# Patient Record
Sex: Female | Born: 1950 | Race: White | Hispanic: No | Marital: Married | Smoking: Never smoker
Health system: Southern US, Community
[De-identification: ages and names within clinical notes are randomized; demographics above are authoritative.]

## PROBLEM LIST (undated history)

## (undated) DIAGNOSIS — C4490 Unspecified malignant neoplasm of skin, unspecified: Secondary | ICD-10-CM

## (undated) DIAGNOSIS — E039 Hypothyroidism, unspecified: Secondary | ICD-10-CM

## (undated) HISTORY — DX: Hypothyroidism, unspecified: E03.9

## (undated) HISTORY — DX: Unspecified malignant neoplasm of skin, unspecified: C44.90

---

## 1999-06-03 ENCOUNTER — Encounter: Payer: Self-pay | Admitting: Family Medicine

## 1999-06-03 ENCOUNTER — Encounter: Admission: RE | Admit: 1999-06-03 | Discharge: 1999-06-03 | Payer: Self-pay | Admitting: Family Medicine

## 2000-06-03 ENCOUNTER — Encounter: Payer: Self-pay | Admitting: Family Medicine

## 2000-06-03 ENCOUNTER — Encounter: Admission: RE | Admit: 2000-06-03 | Discharge: 2000-06-03 | Payer: Self-pay | Admitting: Family Medicine

## 2001-05-25 ENCOUNTER — Other Ambulatory Visit: Admission: RE | Admit: 2001-05-25 | Discharge: 2001-05-25 | Payer: Self-pay | Admitting: Family Medicine

## 2001-06-15 ENCOUNTER — Encounter: Admission: RE | Admit: 2001-06-15 | Discharge: 2001-06-15 | Payer: Self-pay | Admitting: Family Medicine

## 2001-06-15 ENCOUNTER — Encounter: Payer: Self-pay | Admitting: Family Medicine

## 2002-05-26 ENCOUNTER — Other Ambulatory Visit: Admission: RE | Admit: 2002-05-26 | Discharge: 2002-05-26 | Payer: Self-pay | Admitting: Family Medicine

## 2002-06-17 ENCOUNTER — Encounter: Admission: RE | Admit: 2002-06-17 | Discharge: 2002-06-17 | Payer: Self-pay | Admitting: Family Medicine

## 2002-06-17 ENCOUNTER — Encounter: Payer: Self-pay | Admitting: Family Medicine

## 2003-06-01 ENCOUNTER — Other Ambulatory Visit: Admission: RE | Admit: 2003-06-01 | Discharge: 2003-06-01 | Payer: Self-pay | Admitting: Family Medicine

## 2003-06-20 ENCOUNTER — Encounter: Admission: RE | Admit: 2003-06-20 | Discharge: 2003-06-20 | Payer: Self-pay | Admitting: Family Medicine

## 2003-10-17 ENCOUNTER — Other Ambulatory Visit: Admission: RE | Admit: 2003-10-17 | Discharge: 2003-10-17 | Payer: Self-pay | Admitting: Family Medicine

## 2004-06-24 ENCOUNTER — Encounter: Admission: RE | Admit: 2004-06-24 | Discharge: 2004-06-24 | Payer: Self-pay | Admitting: Family Medicine

## 2004-08-13 ENCOUNTER — Other Ambulatory Visit: Admission: RE | Admit: 2004-08-13 | Discharge: 2004-08-13 | Payer: Self-pay | Admitting: Family Medicine

## 2005-06-26 ENCOUNTER — Encounter: Admission: RE | Admit: 2005-06-26 | Discharge: 2005-06-26 | Payer: Self-pay | Admitting: Family Medicine

## 2005-08-15 ENCOUNTER — Other Ambulatory Visit: Admission: RE | Admit: 2005-08-15 | Discharge: 2005-08-15 | Payer: Self-pay | Admitting: Family Medicine

## 2006-06-29 ENCOUNTER — Encounter: Admission: RE | Admit: 2006-06-29 | Discharge: 2006-06-29 | Payer: Self-pay | Admitting: Family Medicine

## 2006-09-01 ENCOUNTER — Other Ambulatory Visit: Admission: RE | Admit: 2006-09-01 | Discharge: 2006-09-01 | Payer: Self-pay | Admitting: Family Medicine

## 2007-07-02 ENCOUNTER — Encounter: Admission: RE | Admit: 2007-07-02 | Discharge: 2007-07-02 | Payer: Self-pay | Admitting: Family Medicine

## 2007-09-06 ENCOUNTER — Other Ambulatory Visit: Admission: RE | Admit: 2007-09-06 | Discharge: 2007-09-06 | Payer: Self-pay | Admitting: Family Medicine

## 2008-07-05 ENCOUNTER — Encounter: Admission: RE | Admit: 2008-07-05 | Discharge: 2008-07-05 | Payer: Self-pay | Admitting: Family Medicine

## 2009-07-09 ENCOUNTER — Encounter: Admission: RE | Admit: 2009-07-09 | Discharge: 2009-07-09 | Payer: Self-pay | Admitting: Internal Medicine

## 2010-02-05 ENCOUNTER — Ambulatory Visit: Payer: Self-pay | Admitting: Pulmonary Disease

## 2010-02-05 DIAGNOSIS — G47411 Narcolepsy with cataplexy: Secondary | ICD-10-CM | POA: Insufficient documentation

## 2010-02-07 DIAGNOSIS — E039 Hypothyroidism, unspecified: Secondary | ICD-10-CM | POA: Insufficient documentation

## 2010-02-13 ENCOUNTER — Telehealth: Payer: Self-pay | Admitting: Internal Medicine

## 2010-02-19 ENCOUNTER — Encounter: Payer: Self-pay | Admitting: Internal Medicine

## 2010-03-15 ENCOUNTER — Telehealth (INDEPENDENT_AMBULATORY_CARE_PROVIDER_SITE_OTHER): Payer: Self-pay | Admitting: *Deleted

## 2010-04-10 ENCOUNTER — Ambulatory Visit: Payer: Self-pay | Admitting: Internal Medicine

## 2010-04-10 DIAGNOSIS — K219 Gastro-esophageal reflux disease without esophagitis: Secondary | ICD-10-CM | POA: Insufficient documentation

## 2010-04-30 ENCOUNTER — Telehealth: Payer: Self-pay | Admitting: Internal Medicine

## 2010-06-19 ENCOUNTER — Telehealth (INDEPENDENT_AMBULATORY_CARE_PROVIDER_SITE_OTHER): Payer: Self-pay | Admitting: *Deleted

## 2010-07-10 ENCOUNTER — Encounter
Admission: RE | Admit: 2010-07-10 | Discharge: 2010-07-10 | Payer: Self-pay | Source: Home / Self Care | Attending: Internal Medicine | Admitting: Internal Medicine

## 2010-08-08 ENCOUNTER — Ambulatory Visit
Admission: RE | Admit: 2010-08-08 | Discharge: 2010-08-08 | Payer: Self-pay | Source: Home / Self Care | Attending: Internal Medicine | Admitting: Internal Medicine

## 2010-08-15 NOTE — Progress Notes (Signed)
Summary: rx ritalin  Phone Note Call from Patient   Caller: Patient Call For: young Summary of Call: pt wants to pick up rx for RITALIN LA 20mg . [phone] Initial call taken by: Tivis Ringer, CNA,  June 19, 2010 11:41 AM  Follow-up for Phone Call        Last OV 04/10/10 Pending OV 08/08/2010 Ritalin rx last given on 04/30/10 #90 x 0 Rx printed and placed on CY's cart along with copy of phone note.  Pt would like to pick up on Friday. Gweneth Dimitri RN  June 19, 2010 2:57 PM   Additional Follow-up for Phone Call Additional follow up Details #1::        Pt aware RX is at front for pick up-will come by office on Friday to get.Reynaldo Minium CMA  June 19, 2010 4:24 PM     Prescriptions: RITALIN LA 20 MG XR24H-CAP (METHYLPHENIDATE HCL) 1-3 daily as needed  #90 x 0   Entered by:   Gweneth Dimitri RN   Authorized by:   Waymon Budge MD   Signed by:   Gweneth Dimitri RN on 06/19/2010   Method used:   Print then Give to Patient   RxID:   [id number]

## 2010-08-15 NOTE — Assessment & Plan Note (Signed)
Summary: rov 4 months///kp   Primary Provider/Referring Provider:  Dr Ricki Miller  CC:  Follow up visit-narcolespy-no complaints..  History of Present Illness: April 10, 2010- Narcolepsy/ cataplexy She started new job, working 4 x 10 hour days. With this change she feels more need to be "on her toes" and has felt the need to increase her Ritalin LA 20 mg 24hr tabs 3 daily instead of her usual 2 tabs. With the higher dose she is getting some breaking out over the bridge of her nose as she has seen before at the high dose. If goes away on weekends, when sometimes she doesn't even need to take 2 daily. Denies palpitation, mood change etc with the higher dose. Has also needed to nap the last couple of days. She feels she is doing well when she has gone a month without falling asleep at her desk. Provigil and Nuvigil had caused mood change and had not helped as much with the sleep issues.  More aware of acid reflux sometimes waking her, cough. Discussed management.  08-18-2010- Narcolepsy/ cataplexy, GERD Nurse-CC: Follow up visit-narcolespy-no complaints. With more time in current job she has settled back to using her Ritalin LA 2 tabs most days and 3 on others. Less often cataplexy often triggered by stress or laughing. Control is good enough.  Heart burn is not waking her since she quit coffee at night and stayed on Acephex, which works best of products she tried.  Dr Ricki Miller will check her thyroid.    Preventive Screening-Counseling & Management  Alcohol-Tobacco     Smoking Status: never  Current Medications (verified): 1)  Aciphex 20 Mg Tbec (Rabeprazole Sodium) .... Take 1 By Mouth Once Daily As Needed 2)  Levothroid 50 Mcg Tabs (Levothyroxine Sodium) .... Take 1 By Mouth Once Daily 3)  Ritalin La 20 Mg Xr24h-Cap (Methylphenidate Hcl) .Marland Kitchen.. 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 Patient never smoked.   Risk Factors: Smoking Status: never (08/18/2010)  Family History: Mother- hx asthma, died 71yo old age Father- died MI, smoker  Review of Systems      See HPI       The patient complains of weight gain and severe indigestion/heartburn.  The patient denies anorexia, fever, weight loss, vision loss, decreased hearing, hoarseness, chest pain, syncope, dyspnea on exertion, peripheral edema, prolonged cough, headaches, hemoptysis, and abdominal pain.         Some wheeze with morning walk from parking lot  Vital Signs:  Patient profile:   60 year old female Height:      65 inches Weight:      205.50 pounds BMI:     34.32 O2 Sat:      96 % on Room air Pulse rate:   84 / minute BP sitting:   122 / 80  (left arm) Cuff size:   regular  Vitals Entered By: Reynaldo Minium CMA (18-Aug-2010 9:28 AM)  O2 Flow:  Room air CC: Follow up visit-narcolespy-no complaints.   Physical Exam  Additional Exam:  General: A/Ox3; pleasant and cooperative, NAD, SKIN:  small inflammed comedones acros bridge of nose NODES: no lymphadenopathy HEENT: [place]/AT, EOM- WNL, Conjuctivae- clear, PERRLA, TM-WNL, Nose- clear, Throat- clear and wnl, Mallampati  III NECK: Supple w/ fair ROM, JVD- none, normal carotid impulses w/o bruits Thyroid- normal to palpation CHEST: Clear to P&A HEART: RRR, no m/g/r heard ABDOMEN: Soft and nl; nml bowel sounds; no organomegaly or masses noted VHQ:IONG, nl pulses, no edema  NEURO: Grossly intact to observation, alert, oriented, appropriate. Not restless or  sleepy.      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 Sleep hygiene is reviewed. She is using and tolerating controlled stimulant drug appropriately.   Problem # 2:  GERD (ICD-530.81)  Maintains control as discussed.  Her updated medication list for this problem includes:    Aciphex 20 Mg Tbec (Rabeprazole sodium) .Marland Kitchen... Take 1 by mouth once daily as needed  Other Orders: Est. Patient Level IV (29528)  Patient Instructions: 1)  Please schedule a follow-up appointment in 6 months. 2)  Refill Ritalin Prescriptions: RITALIN LA 20 MG XR24H-CAP (METHYLPHENIDATE HCL) 1-3 daily as needed  #90 x 0   Entered and Authorized by:   Waymon Budge MD   Signed by:   Waymon Budge MD on 08/08/2010   Method used:   Print then Give to Patient   RxID:   [id number]

## 2010-08-15 NOTE — Medication Information (Signed)
Summary: Tax adviser   Imported By: Lehman Prom 02/19/2010 09:22:56  _____________________________________________________________________  External Attachment:    Type:   Image     Comment:   External Document

## 2010-08-15 NOTE — Progress Notes (Signed)
Summary: RITILIN RX /CB  Phone Note Call from Patient Call back at [phone]   Caller: Patient Call For: YOUNG Summary of Call: NEEDS TO PICK UP HER WRITTEN RX FOR RITILIN LA 20 MG Initial call taken by: Lacinda Axon,  April 30, 2010 3:27 PM  Follow-up for Phone Call        pt last seen by CY 04/10/2010.  4 month f/u appt is scheduled for 1/26/011.  Pt last had rx filled 03/15/2010 for # 90 x 0 refills.  Printed rx and put on CY's cart for him to sign.  Aundra Millet Reynolds LPN  April 30, 2010 3:44 PM   Additional Follow-up for Phone Call Additional follow up Details #1::        Please let patient know Rx is at front for pick up.Reynaldo Minium CMA  April 30, 2010 5:18 PM    lmom for pt to make her aware that rx is up front  and ready to be picked up. Randell Loop CMA  April 30, 2010 5:24 PM     Prescriptions: RITALIN LA 20 MG XR24H-CAP (METHYLPHENIDATE HCL) 1-3 daily as needed  #90 x 0   Entered by:   Arman Filter LPN   Authorized by:   Waymon Budge MD   Signed by:   Arman Filter LPN on 14/78/2956   Method used:   Print then Give to Patient   RxID:   [id number]

## 2010-08-15 NOTE — Progress Notes (Signed)
Summary: Prior Auth for Ritalin  Phone Note Call from Patient Call back at [phone]   Caller: Patient Call For: young Reason for Call: Talk to Nurse Summary of Call: need prior auth on her Ritalin 20mg .  She is almost out of her rx. Initial call taken by: Eugene Gavia,  February 13, 2010 11:08 AM  Follow-up for Phone Call        Jess, do you have the paperwork from pharmacy on this pt's ritalin re a PA?  Aundra Millet Reynolds LPN  February 13, 2010 2:07 PM   Additional Follow-up for Phone Call Additional follow up Details #1::        Called [employer] to initiate PA for ritalin 20 mg.  Answered the questions over the phone.  Rep states that we will be informed of approval/denial by mail Additional Follow-up by: Vernie Murders,  February 13, 2010 3:52 PM    Additional Follow-up for Phone Call Additional follow up Details #2::    Pt instructed that insurance company was contacted on 02-13-10 and prior auth was initiated.  Pt informed that we would contact her as soon as we heard from insurance company. Abigail Miyamoto RN  February 14, 2010 2:02 PM   received denial letter from [place].  Gave papers to [facility].  Aundra Millet Reynolds LPN  February 14, 2010 2:50 PM    Wrong med was done for PA; I called and had correct PA done for Ritalin. Approved for 1 year and pt and pharmacy are aware. Approval fax sent to have scanned in EMR.Reynaldo Minium CMA  February 15, 2010 10:32 AM

## 2010-08-15 NOTE — Assessment & Plan Note (Signed)
Summary: SLEEP DISORDER/ MBW   Primary Provider/Referring Provider:  Dr Ricki Miller  CC:  Sleep new pt..  History of Present Illness: February 05, 2010- 60 yoF who comes to establish for management of Narcolepsy. This dx was made by me 20 years ago with negative NPSG and compatible MSLT. We are looking for old recoreds. Has tried many meds. Now changing jobs and concerned the new employer won't be as tolerant of her daytime naps.  Bedtime 10-11PM, variable sleep latency, wakes briefly 4-5 x/ night but sometimes stays awake. Up at 630-7AM. Naps x 2 hours do help. Husband wakes her if stuck in a dream. No kick or sleep walk, but some snore. No Sleep Paralysis, but describes frequent cataplexy if at all startled, as by a butterfly or bright object. She will stand still till weakness passes. Vison may be disconjugate. She tries to disguise these episodes. Now on Ritalin LA 20 mg two times a day. This has never been enough. It seems to make the skin over her nose break out. Can't remember experience with Adderall. Nuvigil made her depressed Hypothyroid on levothroid.  Preventive Screening-Counseling & Management  Alcohol-Tobacco     Smoking Status: never  Current Medications (verified): 1)  Ritalin La 20 Mg Xr24h-Cap (Methylphenidate Hcl) .... Take 1 By Mouth Two Times A Day 2)  Aciphex 20 Mg Tbec (Rabeprazole Sodium) .... Take 1 By Mouth Once Daily As Needed 3)  Levothroid 50 Mcg Tabs (Levothyroxine Sodium) .... 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 Patient never smoked.   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 Patient never smoked.  Smoking Status:  never  Review of Systems      See HPI       The patient complains of non-productive cough, acid heartburn, indigestion, weight change, anxiety, and joint stiffness or pain.  The patient denies shortness of breath with activity, shortness of breath at rest, productive cough, coughing up blood, chest pain, irregular heartbeats, loss of appetite, abdominal pain, difficulty swallowing, sore throat, tooth/dental problems, headaches, nasal congestion/difficulty breathing through nose, sneezing, itching, ear ache, depression, hand/feet swelling, rash, change in color of mucus, and fever.    Vital Signs:  Patient profile:   60 year old female Height:      65 inches Weight:      203.38 pounds BMI:     33.97 O2 Sat:      96 % on Room air Pulse rate:   113 / minute BP sitting:   130 / 68  (left arm) Cuff size:   regular  Vitals Entered By: Reynaldo Minium CMA (February 05, 2010 3:30 PM)  O2 Flow:  Room air CC: Sleep new pt.   Physical Exam  Additional Exam:  General: A/Ox3; pleasant and cooperative, NAD, SKIN: no rash, lesions NODES: no lymphadenopathy HEENT: [place]/AT, EOM- WNL, Conjuctivae- clear, PERRLA, TM-WNL, Nose- clear, Throat- clear and wnl, Mallampati  III NECK: Supple w/ fair ROM, JVD- none, normal carotid impulses w/o bruits Thyroid-  normal to palpation CHEST: Clear to P&A HEART: RRR, no m/g/r heard ABDOMEN: Soft and nl; nml bowel sounds; no organomegaly or masses noted AVW:UJWJ, nl pulses, no edema  NEURO: Grossly intact to observation, alert, oriented, appropriate. Not restless or sleepy.      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 She understands need for sleep/ naps. We considered her meds. For now she will try increasing her ritalin to take up to 60 mg daily as needed. She will likely need to have an updated sleep study in the future. We discussed side effects and cautions related to ritalin as a controlled drug. Reviewed basics of good sleep hygiene, her responsibility to drive safely.  Problem # 2:  HYPOTHYROIDISM (ICD-244.9) She is satisfied that this is supervised and under control, so it isn't likely to be contributing to somnolence, but that can be kept in mind. Her updated medication list for this problem includes:    Levothroid 50 Mcg Tabs (Levothyroxine sodium) .Marland Kitchen... Take 1 by mouth once daily  Medications Added to Medication List This Visit: 1)  Ritalin La 20 Mg Xr24h-cap (Methylphenidate hcl) .... Take 1 by mouth two times a day 2)  Aciphex 20 Mg Tbec (Rabeprazole sodium) .... Take 1 by mouth once daily as needed 3)  Levothroid 50 Mcg Tabs (Levothyroxine sodium) .... Take 1 by mouth once daily 4)  Ritalin La 20 Mg Xr24h-cap (Methylphenidate hcl) .Marland Kitchen.. 1-3 daily as needed  Other Orders: New Patient Level IV (19147)  Patient Instructions: 1)  Please schedule a follow-up appointment in 2 months. 2)  Script for Ritalin to take up to 3 daily as discussed. Let me know if this doesn't work. Prescriptions: RITALIN LA 20 MG XR24H-CAP (METHYLPHENIDATE HCL) 1-3 daily as needed  #90 x 0   Entered and Authorized by:   Waymon Budge MD   Signed by:   Waymon Budge MD on 02/05/2010   Method used:   Print then Give to Patient   RxID:   [id number]

## 2010-08-15 NOTE — Assessment & Plan Note (Signed)
Summary: rov ///kp   Primary Provider/Referring Provider:  Dr Ricki Miller  CC:  Follow up visit-narcolepsy with cataplexy; Had to take a nap in past 2 days.Marland Kitchen  History of Present Illness:  History of Present Illness: February 05, 2010- 58 yoF who comes to establish for management of Narcolepsy. This dx was made by me 20 years ago with negative NPSG and compatible MSLT. We are looking for old records. Has tried many meds. Now changing jobs and concerned the new employer won't be as tolerant of her daytime naps.  Bedtime 10-11PM, variable sleep latency, wakes briefly 4-5 x/ night but sometimes stays awake. Up at 630-7AM. Naps x 2 hours do help. Husband wakes her if stuck in a dream. No kick or sleep walk, but some snore. No Sleep Paralysis, but describes frequent cataplexy if at all startled, as by a butterfly or bright object. She will stand still till weakness passes. Vision may be disconjugate. She tries to disguise these episodes. Now on Ritalin LA 20 mg two times a day. This has never been enough. It seems to make the skin over her nose break out. Can't remember experience with Adderall. Nuvigil made her depressed Hypothyroid on levothroid.  April 10, 2010- Narcolepsy/ cataplexy She started new job, working 4 x 10 hour days. With this change she feels more need to be "on her toes" and has felt the need to increase her Ritalin LA 20 mg 24hr tabs 3 daily instead of her usual 2 tabs. With the higher dose she is getting some breaking out over the bridge of her nose as she has seen before at the high dose. If goes away on weekends, when sometimes she doesn't even need to take 2 daily. Denies palpitation, mood change etc with the higher dose. Has also needed to nap the last couple of days. She feels she is doing well when she has gone a month without falling asleep at her desk. Provigil and Nuvigil had caused mood change and had not helped as much with the sleep issues.  More aware of acid reflux sometimes  waking her, cough. Discussed management.   Preventive Screening-Counseling & Management  Alcohol-Tobacco     Smoking Status: never  Current Medications (verified): 1)  Aciphex 20 Mg Tbec (Rabeprazole Sodium) .... Take 1 By Mouth Once Daily As Needed 2)  Levothroid 50 Mcg Tabs (Levothyroxine Sodium) .... Take 1 By Mouth Once Daily 3)  Ritalin La 20 Mg Xr24h-Cap (Methylphenidate Hcl) .Marland Kitchen.. 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 Patient never smoked.   Risk Factors: Smoking Status: never (04/10/2010)  Review of Systems      See HPI  The patient denies anorexia, fever, weight loss, weight gain, vision loss, decreased hearing, hoarseness, chest pain, syncope, prolonged cough, headaches, hemoptysis, abdominal pain, melena, severe indigestion/heartburn, difficulty walking, unusual weight change, abnormal bleeding, and enlarged lymph nodes.         some acid reflux and cough  Vital Signs:  Patient profile:   60 year old female Height:      65 inches Weight:      202.38 pounds BMI:  33.80 O2 Sat:      96 % on Room air Pulse rate:   95 / minute BP sitting:   126 / 80  (left arm) Cuff size:   regular  Vitals Entered By: Reynaldo Minium CMA (April 10, 2010 9:04 AM)  O2 Flow:  Room air CC: Follow up visit-narcolepsy with cataplexy; Had to take a nap in past 2 days.   Physical Exam  Additional Exam:  General: A/Ox3; pleasant and cooperative, NAD, SKIN: small inflammed comedones acros bridge of nose NODES: no  lymphadenopathy HEENT: [place]/AT, EOM- WNL, Conjuctivae- clear, PERRLA, TM-WNL, Nose- clear, Throat- clear and wnl, Mallampati  III NECK: Supple w/ fair ROM, JVD- none, normal carotid impulses w/o bruits Thyroid- normal to palpation CHEST: Clear to P&A HEART: RRR, no m/g/r heard ABDOMEN: Soft and nl; nml bowel sounds; no organomegaly or masses noted ZOX:WRUE, nl pulses, no edema  NEURO: Grossly intact to observation, alert, oriented, appropriate. Not restless or sleepy.      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 We discussed the skin rash, which is not allergy but reflects more skin gland activity. More regular gentle cleanising may help. See dermatologist if it persists. Otherwise, we discussed the new job a a stress.   Problem # 2:  GERD (ICD-530.81)  She wasn't using aciphex regularly. We discussed reflux management and concerns. she will use Aciphex regularly and can add otc pepcid before evening meal if needecd Her updated medication list for this problem includes:    Aciphex 20 Mg Tbec (Rabeprazole sodium) .Marland Kitchen... Take 1 by mouth once daily as needed  Other Orders: Est. Patient Level IV (45409)  Patient Instructions: 1)  Please schedule a follow-up appointment in 4 months. 2)  Continue Ritalin and naps 3)  Take the Aciphex daily before a meal. Don't lie down for 2 hours after eating. Ok to add otc pepcid before your evening meal if needed, but discuss with your primary doctor. 4)  Manage the rash like acne. Consider seeing a dermatologist if needed.

## 2010-08-15 NOTE — Progress Notes (Signed)
Summary: ritilin rx > ready to pick up   Phone Note Call from Patient Call back at Home Phone [phone]   Caller: Patient Call For: young Summary of Call: pt wants to pick up rx next week for her ritilin la 1 month supply Initial call taken by: Lacinda Axon,  March 15, 2010 12:51 PM  Follow-up for Phone Call        Last OV 7.26.11 Pending OV 8.28.11 Pt requesting to pick up 1 month rx for ritalin -- requesting call back at [phone] when rx ready for pick up. Rx. printed and placed on CY's cart for signature.    Follow-up by: Gweneth Dimitri RN,  March 15, 2010 1:42 PM  Additional Follow-up for Phone Call Additional follow up Details #1::        rx signed and put up front for pt to pick up at her ocnvenience.  ATC x2, line busy. Boone Master CNA/MA  March 15, 2010 2:22 PM     Additional Follow-up for Phone Call Additional follow up Details #2::    called, spoke with pt.  Pt informed rx signed by CY and at front to pick up at her convience.  She verbalized understanding. Follow-up by: Gweneth Dimitri RN,  March 15, 2010 3:05 PM  Prescriptions: RITALIN LA 20 MG XR24H-CAP (METHYLPHENIDATE HCL) 1-3 daily as needed  #90 x 0   Entered by:   Gweneth Dimitri RN   Authorized by:   Waymon Budge MD   Signed by:   Gweneth Dimitri RN on 03/15/2010   Method used:   Print then Give to Patient   RxID:   [id number]

## 2010-10-21 ENCOUNTER — Telehealth: Payer: Self-pay | Admitting: Internal Medicine

## 2010-10-21 MED ORDER — METHYLPHENIDATE HCL ER (LA) 20 MG PO CP24: ORAL_CAPSULE | ORAL | Status: DC

## 2010-10-21 NOTE — Telephone Encounter (Signed)
Left message that Rx is at front for pick up on Friday as requested; if any questions or concerns please call the office.[name]

## 2010-10-21 NOTE — Telephone Encounter (Signed)
Printed out rx for ritalin and was placed on Dr. Roxy Cedar cart for signature. Please advise. Thanks  Carver Fila, CMA

## 2010-11-28 ENCOUNTER — Telehealth: Payer: Self-pay | Admitting: Internal Medicine

## 2010-11-28 MED ORDER — METHYLPHENIDATE HCL ER (LA) 20 MG PO CP24: ORAL_CAPSULE | ORAL | Status: DC

## 2010-11-28 NOTE — Telephone Encounter (Signed)
RX signed and placed at front. Pt aware.Carron Curie, CMA

## 2010-11-28 NOTE — Telephone Encounter (Signed)
Rx printed and placed on CDY's cart to sign, thanks

## 2011-01-08 ENCOUNTER — Telehealth: Payer: Self-pay | Admitting: Internal Medicine

## 2011-01-08 MED ORDER — METHYLPHENIDATE HCL ER (LA) 20 MG PO CP24: ORAL_CAPSULE | ORAL | Status: DC

## 2011-01-08 NOTE — Telephone Encounter (Signed)
rx signed and placed up front- pt aware.

## 2011-01-08 NOTE — Telephone Encounter (Signed)
Rx printed and placed on CDY's cart to be signed  

## 2011-02-12 ENCOUNTER — Encounter: Payer: Self-pay | Admitting: Internal Medicine

## 2011-02-17 ENCOUNTER — Ambulatory Visit: Payer: Self-pay | Admitting: Internal Medicine

## 2011-03-14 ENCOUNTER — Encounter: Payer: Self-pay | Admitting: Internal Medicine

## 2011-03-14 ENCOUNTER — Ambulatory Visit (INDEPENDENT_AMBULATORY_CARE_PROVIDER_SITE_OTHER): Payer: Managed Care, Other (non HMO) | Admitting: Internal Medicine

## 2011-03-14 VITALS — BP 120/94 | HR 78 | Ht 65.5 in | Wt 199.6 lb

## 2011-03-14 DIAGNOSIS — G47411 Narcolepsy with cataplexy: Secondary | ICD-10-CM

## 2011-03-14 MED ORDER — METHYLPHENIDATE HCL ER (LA) 20 MG PO CP24: ORAL_CAPSULE | ORAL | Status: DC

## 2011-03-14 NOTE — Assessment & Plan Note (Addendum)
Good control. We need to continue to watch for adequate sleep hygiene and potential confounding factors like sleep apnea.  Her use of Ritalin is appropriate and well tolerated.

## 2011-03-14 NOTE — Progress Notes (Signed)
Subjective:    Patient ID: Pamela Hernandez, female    DOB: Feb 21, 1951, 60 y.o.   MRN: [id number]  HPI 03/14/11- 60 year old female never smoker followed for narcolepsy/cataplexy, complicated by hypothyroidism, GERD. Last here August 08, 2010- Still using Ritalin LA- 2 and sometimes 3/day, with less frequent cataplexy. Sudden laughter is her trigger and she will need to sit quickly to avoid falling. She is satisfied with the ritalin dose. Eats small portion at lunch and naps in car befo third re return to work.  She doesn't want to try imipramine or another daily med to prevent cataplexy. On higher dose thyroid hormone, in heat, her skin breaks out under her computer glasses.  She does snore. Sleeps on couch to avoid bothering husband who is also a poor sleeper.   Review of Systems Constitutional:   No-   weight loss, night sweats, fevers, chills, fatigue, lassitude. HEENT:   No-  headaches, difficulty swallowing, tooth/dental problems, sore throat,       No-  sneezing, itching, ear ache, nasal congestion, post nasal drip,  CV:  No-   chest pain, orthopnea, PND, swelling in lower extremities, anasarca, dizziness, palpitations Resp: No-   shortness of breath with exertion or at rest.              No-   productive cough,  No non-productive cough,  No-  coughing up of blood.              No-   change in color of mucus.  No- wheezing.   Skin: No-  rash or lesions except as per history of present illness. GI:  No-   heartburn, indigestion, abdominal pain, nausea, vomiting, diarrhea,                 change in bowel habits, loss of appetite GU: No-   dysuria, change in color of urine, no urgency or frequency.  No- flank pain. MS:  No-   joint pain or swelling.  No- decreased range of motion.  No- back pain. Neuro- grossly normal to observation, Or:  Psych:  No- change in mood or affect. No depression or anxiety.  No memory loss.      Objective:   Physical Exam General- Alert, Oriented,  Affect-appropriate, Distress- none acute; overweight Skin- rash-over bridge of nose , lesions- none, excoriation- none Lymphadenopathy- none Head- atraumatic            Eyes- Gross vision intact, PERRLA, conjunctivae clear secretions            Ears- Hearing, canals- normal            Nose- Clear, No- Septal dev, mucus, polyps, erosion, perforation             Throat- Mallampati II-III , mucosa clear , drainage- none, tonsils- atrophic Neck- flexible , trachea midline, no stridor , thyroid nl, carotid no bruit Chest - symmetrical excursion , unlabored           Heart/CV- RRR , no murmur , no gallop  , no rub, nl s1 s2                           - JVD- none , edema- none, stasis changes- none, varices- none           Lung- clear to P&A, wheeze- none, cough- none , dullness-none, rub- none           Chest wall-  Abd- tender-no, distended-no, bowel sounds-present, HSM- no Br/ Gen/ Rectal- Not done, not indicated Extrem- cyanosis- none, clubbing, none, atrophy- none, strength- nl Neuro- grossly intact to observation         Assessment & Plan:

## 2011-03-14 NOTE — Patient Instructions (Signed)
Ritalin script refilled  Watch out for problems that keep you from sleeping restfully

## 2011-03-18 ENCOUNTER — Telehealth: Payer: Self-pay | Admitting: Internal Medicine

## 2011-03-18 NOTE — Telephone Encounter (Signed)
Checked the PA stack and there is no PA request for this, so I have called and [facility]. Will await fax.

## 2011-03-18 NOTE — Telephone Encounter (Signed)
[facility] has been APPROVED for 1 year starting with today's date. LMOM so pt can be notified.  [facility] notified of [facility].

## 2011-03-18 NOTE — Telephone Encounter (Signed)
Fax received and given to Lawson Fiscal since she is doing PA's this pm. Ok per [place] and will forward to her to document, thanks

## 2011-03-19 NOTE — Telephone Encounter (Signed)
Spoke with pt and notified med approved x 1 year. Pt verbalized understanding.

## 2011-05-07 ENCOUNTER — Telehealth: Payer: Self-pay | Admitting: Internal Medicine

## 2011-05-07 MED ORDER — METHYLPHENIDATE HCL ER (LA) 20 MG PO CP24: ORAL_CAPSULE | ORAL | Status: DC

## 2011-05-07 NOTE — Telephone Encounter (Signed)
Rx signed and left message on voicemail that rx is at front for pick up. If any questions or concerns to call our office.

## 2011-05-07 NOTE — Telephone Encounter (Signed)
Pt recently saw CY on 03/14/11 and was given rx for Ritalin 20mg  # 90 x 0 refills and told to f/u in 1 year.  Pt calling requesting rx.  Printed rx and put on CY's cart.

## 2011-05-26 ENCOUNTER — Other Ambulatory Visit: Payer: Self-pay | Admitting: Internal Medicine

## 2011-05-26 DIAGNOSIS — Z1231 Encounter for screening mammogram for malignant neoplasm of breast: Secondary | ICD-10-CM

## 2011-06-30 ENCOUNTER — Telehealth: Payer: Self-pay | Admitting: Internal Medicine

## 2011-06-30 MED ORDER — METHYLPHENIDATE HCL ER (LA) 20 MG PO CP24: ORAL_CAPSULE | ORAL | Status: DC

## 2011-06-30 NOTE — Telephone Encounter (Signed)
I called and left message that patient could come by and pick up Rx. If any questions or concerns please call the office.

## 2011-06-30 NOTE — Telephone Encounter (Signed)
Rx has been printed and placed on cdy cart for signature. Please advise Dr. Young ,thanks 

## 2011-07-14 ENCOUNTER — Ambulatory Visit: Payer: Managed Care, Other (non HMO)

## 2011-07-16 ENCOUNTER — Ambulatory Visit
Admission: RE | Admit: 2011-07-16 | Discharge: 2011-07-16 | Disposition: A | Discharge status: Home / Self Care | Payer: Managed Care, Other (non HMO) | Source: Ambulatory Visit | Attending: Internal Medicine | Admitting: Internal Medicine

## 2011-07-16 DIAGNOSIS — Z1231 Encounter for screening mammogram for malignant neoplasm of breast: Secondary | ICD-10-CM

## 2011-08-15 ENCOUNTER — Telehealth: Payer: Self-pay | Admitting: Internal Medicine

## 2011-08-15 MED ORDER — METHYLPHENIDATE HCL ER (LA) 20 MG PO CP24: ORAL_CAPSULE | ORAL | Status: DC

## 2011-08-15 NOTE — Telephone Encounter (Signed)
I left message that Rx at front for pick up.

## 2011-08-15 NOTE — Telephone Encounter (Signed)
I spoke with pt and she states just to print out for a 30 day supply bc she is not sure if her insurance will cover a 90 day supply or not. I have printed off rx and placed on cdy cart for signature. Please advise Dr. Maple Hudson thanks

## 2011-11-14 ENCOUNTER — Telehealth: Payer: Self-pay | Admitting: Internal Medicine

## 2011-11-14 MED ORDER — METHYLPHENIDATE HCL ER (LA) 20 MG PO CP24: ORAL_CAPSULE | ORAL | Status: DC

## 2011-11-14 NOTE — Telephone Encounter (Signed)
Called spoke with patient to verify the medication requested.  Ritalin 20mg  1-3 daily as needed.  Patient last seen by CDY 8.31.12, follow up in 1 year > scheduled for 8.30.13.  Ritalin last filled #90 for 2.1.13.  Rx printed for CDY to sign.  Please call pt when ready - she would like to pick this up today.  Thanks.

## 2011-11-14 NOTE — Telephone Encounter (Signed)
Pt aware that Rx is at front for pick up.  

## 2012-01-07 ENCOUNTER — Telehealth: Payer: Self-pay | Admitting: Internal Medicine

## 2012-01-07 MED ORDER — METHYLPHENIDATE HCL ER (LA) 20 MG PO CP24: ORAL_CAPSULE | ORAL | Status: DC

## 2012-01-07 NOTE — Telephone Encounter (Signed)
LMOVM about phone note complete and she can come by and pick up.

## 2012-01-07 NOTE — Telephone Encounter (Signed)
Rx was printed and placed on CDY's cart to be signed. Thanks!

## 2012-03-02 ENCOUNTER — Telehealth: Payer: Self-pay | Admitting: Internal Medicine

## 2012-03-02 MED ORDER — METHYLPHENIDATE HCL ER (LA) 20 MG PO CP24: ORAL_CAPSULE | ORAL | Status: DC

## 2012-03-02 NOTE — Telephone Encounter (Signed)
RX at front for pick up-left message for patient about this.

## 2012-03-02 NOTE — Telephone Encounter (Signed)
Last OV with Dr. Maple Hudson 03/14/11 -- f/u in 1 yr Pending OV with Dr. Maple Hudson 03/12/12 Ritalin 20 mg rx last printed on 01/07/12 # 90 x 0. Ritalin 20 mg rx printed and placed on Dr. Roxy Cedar cart for signature along with an envelope.

## 2012-03-12 ENCOUNTER — Encounter: Payer: Self-pay | Admitting: Internal Medicine

## 2012-03-12 ENCOUNTER — Ambulatory Visit (INDEPENDENT_AMBULATORY_CARE_PROVIDER_SITE_OTHER): Payer: Managed Care, Other (non HMO) | Admitting: Internal Medicine

## 2012-03-12 VITALS — BP 122/78 | HR 109 | Ht 65.5 in | Wt 201.2 lb

## 2012-03-12 DIAGNOSIS — G47411 Narcolepsy with cataplexy: Secondary | ICD-10-CM

## 2012-03-12 MED ORDER — METHYLPHENIDATE HCL 10 MG PO TABS: ORAL_TABLET | ORAL | Status: DC

## 2012-03-12 MED ORDER — METHYLPHENIDATE HCL ER (LA) 20 MG PO CP24: ORAL_CAPSULE | ORAL | Status: DC

## 2012-03-12 NOTE — Progress Notes (Signed)
Subjective:    Patient ID: Pamela Hernandez, female    DOB: June 01, 1951, 61 y.o.   MRN: [id number]  HPI 03/14/11- 61 year old female never smoker followed for narcolepsy/cataplexy, complicated by hypothyroidism, GERD. Last here August 08, 2010- Still using Ritalin LA- 2 and sometimes 3/day, with less frequent cataplexy. Sudden laughter is her trigger and she will need to sit quickly to avoid falling. She is satisfied with the ritalin dose. Eats small portion at lunch and naps in car before return to work.  She doesn't want to try imipramine or another daily med to prevent cataplexy. On higher dose thyroid hormone, in heat, her skin breaks out under her computer glasses.  She does snore. Sleeps on couch to avoid bothering husband who is also a poor sleeper.   03/12/12- 61 year old female never smoker followed for narcolepsy/cataplexy, complicated by hypothyroidism, GERD. Still staying tired; tries not to take all medications through the day because its causes her to not sleep. Insurance would like patient to get 3 month supply on Ritalin. Drowsy if passenger in car. Tired much of the day. Her employer allows her to put her head down briefly for a nap he tries not to take stimulant medications on weekends. Less cataplexy in past years, still triggered by hard laugh startled. 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 She denies palpitation, chest pain, syncope  Review of Systems-see HPI Constitutional:   No-   weight loss, night sweats, fevers, chills, fatigue, lassitude. HEENT:   No-  headaches, difficulty swallowing, tooth/dental problems, sore throat,       No-  sneezing, itching, ear ache, nasal congestion, post nasal drip,  CV:  No-   chest pain, orthopnea, PND, swelling in lower extremities, anasarca, dizziness, palpitations Resp: No-   shortness of breath with exertion or at rest.              No-   productive cough,  No non-productive cough,  No-  coughing up of  blood.              No-   change in color of mucus.  No- wheezing.   Skin: No-  rash or lesions except as per history of present illness. GI:  No-   heartburn, indigestion, abdominal pain, nausea, vomiting,  GU: MS:  No-   joint pain or swelling.  Neuro- nothing unusual outside of history of present illness Psych:  No- change in mood or affect. No depression or anxiety.  No memory loss. Objective:   Physical Exam General- Alert, Oriented, Affect-appropriate, Distress- none acute; overweight Skin- rash-over bridge of nose , lesions- none, excoriation- none Lymphadenopathy- none Head- atraumatic            Eyes- Gross vision intact, PERRLA, conjunctivae clear secretions            Ears- Hearing, canals- normal            Nose- Clear, No- Septal dev, mucus, polyps, erosion, perforation             Throat- Mallampati II-III , mucosa clear , drainage- none, tonsils- atrophic Neck- flexible , trachea midline, no stridor , thyroid nl, carotid no bruit Chest - symmetrical excursion , unlabored           Heart/CV- RRR , no murmur , no gallop  , no rub, nl s1 s2                           -  JVD- none , edema- none, stasis changes- none, varices- none           Lung- clear to P&A, wheeze- none, cough- none , dullness-none, rub- none           Chest wall-  Abd-  Br/ Gen/ Rectal- Not done, not indicated Extrem- cyanosis- none, clubbing, none, atrophy- none, strength- nl Neuro- grossly intact to observation  Assessment & Plan:

## 2012-03-12 NOTE — Patient Instructions (Addendum)
Ritalin LA script written for 90 days   Ritalin 10 mg regular release written for trial as a patch as needed for occasional use.

## 2012-03-23 NOTE — Assessment & Plan Note (Signed)
History strongly consistent with narcolepsy. She is still having cataplexy but less often past years. Her long-acting Ritalin still leaves her drowsy at times. Fortunately her employer has been able to cooperate brief naps not want to abuse this Plan-try adding regular release Ritalin as a "patch" if needed. Continue attention to sleep hygiene

## 2012-06-16 ENCOUNTER — Other Ambulatory Visit: Payer: Self-pay | Admitting: Internal Medicine

## 2012-06-16 DIAGNOSIS — Z1231 Encounter for screening mammogram for malignant neoplasm of breast: Secondary | ICD-10-CM

## 2012-07-16 ENCOUNTER — Inpatient Hospital Stay: Admission: RE | Admit: 2012-07-16 | Payer: Managed Care, Other (non HMO) | Source: Ambulatory Visit

## 2012-07-16 ENCOUNTER — Ambulatory Visit
Admission: RE | Admit: 2012-07-16 | Discharge: 2012-07-16 | Disposition: A | Discharge status: Home / Self Care | Payer: Managed Care, Other (non HMO) | Source: Ambulatory Visit | Attending: Internal Medicine | Admitting: Internal Medicine

## 2012-07-16 DIAGNOSIS — Z1231 Encounter for screening mammogram for malignant neoplasm of breast: Secondary | ICD-10-CM

## 2012-10-07 ENCOUNTER — Telehealth: Payer: Self-pay | Admitting: Internal Medicine

## 2012-10-07 MED ORDER — METHYLPHENIDATE HCL ER (LA) 20 MG PO CP24: ORAL_CAPSULE | ORAL | Status: DC

## 2012-10-07 NOTE — Telephone Encounter (Signed)
Last OV 03-12-12, last refill on 03-12-12 for ritalin 20mg  #270 1-3 daily as needed. Pt states she will pick-up rx tomorrow. Rx printed and placed on CY look-at to sign. Katie aware to place up front once signed. Carron Curie, CMA

## 2012-12-31 ENCOUNTER — Telehealth: Payer: Self-pay | Admitting: Internal Medicine

## 2012-12-31 MED ORDER — METHYLPHENIDATE HCL 10 MG PO TABS: ORAL_TABLET | ORAL | Status: DC

## 2012-12-31 NOTE — Telephone Encounter (Signed)
I spoke with pt and confirmed rx needed and confirmed address. RX printed off and placed on CDY cart for signature.

## 2012-12-31 NOTE — Telephone Encounter (Signed)
Mailed to patient as requested.

## 2013-01-07 ENCOUNTER — Telehealth: Payer: Self-pay | Admitting: Internal Medicine

## 2013-01-07 NOTE — Telephone Encounter (Signed)
Calling to confirm RX directions for ritalin. Advised this is how this has been giving to her x 02/2012. Nothing further was needed

## 2013-02-09 ENCOUNTER — Telehealth: Payer: Self-pay | Admitting: Internal Medicine

## 2013-02-09 MED ORDER — METHYLPHENIDATE HCL ER (LA) 20 MG PO CP24: ORAL_CAPSULE | ORAL | Status: DC

## 2013-02-09 NOTE — Telephone Encounter (Signed)
Script signed.

## 2013-02-09 NOTE — Telephone Encounter (Signed)
RX printed off and placed on CDY cart for signature.

## 2013-02-10 NOTE — Telephone Encounter (Signed)
Mailed to patient

## 2013-03-18 ENCOUNTER — Encounter: Payer: Self-pay | Admitting: Internal Medicine

## 2013-03-18 ENCOUNTER — Ambulatory Visit (INDEPENDENT_AMBULATORY_CARE_PROVIDER_SITE_OTHER): Payer: Managed Care, Other (non HMO) | Admitting: Internal Medicine

## 2013-03-18 VITALS — BP 128/82 | HR 97 | Ht 65.5 in | Wt 202.0 lb

## 2013-03-18 DIAGNOSIS — G47411 Narcolepsy with cataplexy: Secondary | ICD-10-CM

## 2013-03-18 MED ORDER — METHYLPHENIDATE HCL ER (LA) 20 MG PO CP24: ORAL_CAPSULE | ORAL | Status: DC

## 2013-03-18 MED ORDER — METHYLPHENIDATE HCL 10 MG PO TABS: ORAL_TABLET | ORAL | Status: DC

## 2013-03-18 MED ORDER — LEVOTHYROXINE SODIUM 75 MCG PO TABS: ORAL_TABLET | ORAL | Status: DC

## 2013-03-18 NOTE — Progress Notes (Signed)
Subjective:    Patient ID: Pamela Hernandez, female    DOB: 11/11/1950, 62 y.o.   MRN: [id number]  HPI 03/14/11- 62 year old female never smoker followed for narcolepsy/cataplexy, complicated by hypothyroidism, GERD. Last here August 08, 2010- Still using Ritalin LA- 2 and sometimes 3/day, with less frequent cataplexy. Sudden laughter is her trigger and she will need to sit quickly to avoid falling. She is satisfied with the ritalin dose. Eats small portion at lunch and naps in car before return to work.  She doesn't want to try imipramine or another daily med to prevent cataplexy. On higher dose thyroid hormone, in heat, her skin breaks out under her computer glasses.  She does snore. Sleeps on couch to avoid bothering husband who is also a poor sleeper.   03/12/12- 62 year old female never smoker followed for narcolepsy/cataplexy, complicated by hypothyroidism, GERD. Still staying tired; tries not to take all medications through the day because its causes her to not sleep. Insurance would like patient to get 3 month supply on Ritalin. Drowsy if passenger in car. Tired much of the day. Her employer allows her to put her head down briefly for a nap she tries not to take stimulant medications on weekends. Less cataplexy in past years, still triggered by hard laugh startled. 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 She denies palpitation, chest pain, syncope  03/18/13- 62 year old female never smoker followed for narcolepsy/cataplexy, complicated by hypothyroidism, GERD. FOLLOWS FOR:  Sleeping worse since last OV.  Sleeping aproximatly 4 hours per night.  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 Ritalin LA 20 mg twice daily. Reglan 10 mg occasionally. Naps helps some  Review of Systems-see HPI Constitutional:   No-   weight loss, night sweats, fevers, chills, +fatigue,  lassitude. HEENT:   No-  headaches, difficulty swallowing, tooth/dental problems, sore throat,       No-  sneezing, itching, ear ache, nasal congestion, post nasal drip,  CV:  No-   chest pain, orthopnea, PND, swelling in lower extremities, anasarca, dizziness, palpitations Resp: No-   shortness of breath with exertion or at rest.              No-   productive cough,  No non-productive cough,  No-  coughing up of blood.              No-   change in color of mucus.  No- wheezing.   Skin: No-  rash or lesions except as per history of present illness. GI:  No-   heartburn, indigestion, abdominal pain, nausea, vomiting,  GU: MS:  No-   joint pain or swelling.  Neuro- nothing unusual outside of history of present illness Psych:  No- change in mood or affect. + depression or anxiety.  No memory loss. Objective:   Physical Exam General- Alert, Oriented, Affect-appropriate, Distress- none acute; overweight Skin- rash-over bridge of nose , lesions- none, excoriation- none Lymphadenopathy- none Head- atraumatic            Eyes- Gross vision intact, PERRLA, conjunctivae clear secretions            Ears- Hearing, canals- normal            Nose- Clear, No- Septal dev, mucus, polyps, erosion, perforation             Throat- Mallampati II-III , mucosa clear , drainage- none, tonsils- atrophic Neck-  flexible , trachea midline, no stridor , thyroid nl, carotid no bruit Chest - symmetrical excursion , unlabored           Heart/CV- RRR , no murmur , no gallop  , no rub, nl s1 s2                           - JVD- none , edema- none, stasis changes- none, varices- none           Lung- clear to P&A, wheeze- none, cough- none , dullness-none, rub- none           Chest wall-  Abd-  Br/ Gen/ Rectal- Not done, not indicated Extrem- cyanosis- none, clubbing, none, atrophy- none, strength- nl Neuro- grossly intact to observation  Assessment & Plan:

## 2013-03-18 NOTE — Patient Instructions (Addendum)
Scripts refilled for methylphenidate  Please call as needed

## 2013-03-27 NOTE — Assessment & Plan Note (Signed)
We discussed what medications can do and emphasized the importance of frequent naps. Continue on present meds

## 2013-04-15 ENCOUNTER — Telehealth: Payer: Self-pay | Admitting: Internal Medicine

## 2013-04-15 NOTE — Telephone Encounter (Signed)
Called the pharmacy and they are going to refax the PA

## 2013-04-15 NOTE — Telephone Encounter (Signed)
Called [facility] and was placed on hold.   [phone]  PT ID #  [id number].    Pt needs PA done for   Methylphenidate LA 20 mg  #270  Take 1-3 capsules by mouth as needed Methylphenidate 10 mg  #30   1 tablet by mouth every 4 hours as needed.   Was placed on hold and no one ever came back to the phone.   PA was done through cover my meds.

## 2013-04-20 NOTE — Telephone Encounter (Signed)
CY just signed and gave me the PA forms today-I have faxed papers back and will hold message for approval/denial from her insurance.

## 2013-04-20 NOTE — Telephone Encounter (Signed)
Please advise Florentina Addison if you have received anything back on pt thanks

## 2013-04-26 NOTE — Telephone Encounter (Signed)
CY has given me the papers from [facility] to call them as they have denied the Rx based on not receiving more information (that was never requested).

## 2013-04-26 NOTE — Telephone Encounter (Signed)
Katie, have you received a notice regarding this PA?  Thanks!

## 2013-05-02 NOTE — Telephone Encounter (Signed)
Pamela Hernandez is there an update on this? Please advise thanks

## 2013-05-04 NOTE — Telephone Encounter (Signed)
LMTC x 1 for pt.  Let pt know Methylphenidate LA was approved but Methylphenidate 10 mg was resent to insurance to decide.

## 2013-05-04 NOTE — Telephone Encounter (Signed)
Methylphenidate LA has been approved from 04-20-13 through 04-20-16; the Methylphenidate 10 mg PA has been resent to insurance to decide.

## 2013-05-06 NOTE — Telephone Encounter (Signed)
Patient aware that Methylphenidate LA has been approved from 04-20-13 through 04-20-16. Patient also aware that we are still waiting on determ of the Methylphenidate 10 mg -- PA has been resent to insurance to decide on 05/04/13 by Florentina Addison (according to previous msg).   Will forward back to Katie to keep an eye our for approval and to follow up on.

## 2013-05-17 ENCOUNTER — Telehealth: Payer: Self-pay | Admitting: Internal Medicine

## 2013-05-17 NOTE — Telephone Encounter (Signed)
LMTCB- need to know which Rx of ritalin is patient wanting as well as did insurance approve the 10mg  as I have not gotten any information on patient.

## 2013-05-17 NOTE — Telephone Encounter (Signed)
Ok to refill 

## 2013-05-17 NOTE — Telephone Encounter (Signed)
Last OV 03/18/13 No Pending OV Last fill on 20mg  and 10mg  tablets 03/18/13  Allergies  Allergen Reactions  . Shellfish Allergy     Throat swelling   Current Outpatient Prescriptions on File Prior to Visit  Medication Sig Dispense Refill  . levothyroxine (SYNTHROID, LEVOTHROID) 75 MCG tablet 75 mcg daily  30 tablet  prn  . methylphenidate (RITALIN LA) 20 MG 24 hr capsule 1-3 daily as needed  270 capsule  0  . methylphenidate (RITALIN) 10 MG tablet 1 every 4 hours if needed  30 tablet  0  . pantoprazole (PROTONIX) 40 MG tablet Take 1 tablet by mouth daily.       No current facility-administered medications on file prior to visit.    CY - please advise on refill, thanks.

## 2013-05-18 ENCOUNTER — Other Ambulatory Visit: Payer: Self-pay

## 2013-05-18 DIAGNOSIS — Z1231 Encounter for screening mammogram for malignant neoplasm of breast: Secondary | ICD-10-CM

## 2013-05-18 NOTE — Telephone Encounter (Signed)
ritilin- la 20mg  they haven't approved it yet please leave detailed info unable to take calls at work she said she can pick up on monday

## 2013-05-19 NOTE — Telephone Encounter (Signed)
[email or web address].Jennifer Castillo, CMA  

## 2013-05-19 NOTE — Telephone Encounter (Signed)
Please find out from patient why she needs a Rx so soon for 20 mg tablets; this Rx was printed on 03-18-13 for #270; she should have some left according to qty given and directions on Rx.

## 2013-05-20 MED ORDER — METHYLPHENIDATE HCL ER (LA) 20 MG PO CP24: ORAL_CAPSULE | ORAL | Status: DC

## 2013-05-20 NOTE — Telephone Encounter (Addendum)
Pt isn't been able to get a 90 day supply for the ritalin.  Pt states that they last time she went to pharmacy, she was only able to obtain less than a 30 day supply.  Pt would like to get p/u this Rx on Monday.  Pt states she has never been able to get 90 day supplies of this & needs this written for 30 days Rx.  Pamela Hernandez

## 2013-05-20 NOTE — Telephone Encounter (Signed)
Mailed to patient as requested.

## 2013-05-20 NOTE — Telephone Encounter (Signed)
Rx placed on CY cart to sign. Pt requests rx to be mailed. Carron Curie, CMA

## 2013-05-20 NOTE — Telephone Encounter (Signed)
Ok to write for 30 day script for each, thanks

## 2013-05-20 NOTE — Telephone Encounter (Signed)
[facility] I was advised pt received ritalin 10 mg 04/14/13 #30 and ritalin 20 mg #79 on 04/22/13. Please advise Dr. Maple Hudson thanks

## 2013-05-23 NOTE — Telephone Encounter (Signed)
Please advise Katie thanks 

## 2013-06-13 NOTE — Telephone Encounter (Signed)
Spoke with [facility] after multiple calls and they informed me that both Rx's have been approved for patient.

## 2013-07-19 ENCOUNTER — Ambulatory Visit
Admission: RE | Admit: 2013-07-19 | Discharge: 2013-07-19 | Disposition: A | Discharge status: Home / Self Care | Payer: Managed Care, Other (non HMO) | Source: Ambulatory Visit

## 2013-07-19 DIAGNOSIS — Z1231 Encounter for screening mammogram for malignant neoplasm of breast: Secondary | ICD-10-CM

## 2013-07-29 ENCOUNTER — Telehealth: Payer: Self-pay | Admitting: Internal Medicine

## 2013-07-29 NOTE — Telephone Encounter (Signed)
lmomtcb x1 for pt Does she need both RX's on file? Need to confirm mailing address.

## 2013-08-01 MED ORDER — METHYLPHENIDATE HCL ER (LA) 20 MG PO CP24: ORAL_CAPSULE | ORAL | Status: DC

## 2013-08-01 NOTE — Telephone Encounter (Signed)
Spoke with patient-she states she needs Ritalin LA 20 mg Rx printed and mailed to her home address(confirmed with patient). Pt aware that we will place in mail today. Nothing more needed; will sign off on message.

## 2013-10-25 ENCOUNTER — Telehealth: Payer: Self-pay | Admitting: Internal Medicine

## 2013-10-25 MED ORDER — METHYLPHENIDATE HCL ER (LA) 20 MG PO CP24: ORAL_CAPSULE | ORAL | Status: DC

## 2013-10-25 NOTE — Telephone Encounter (Signed)
Last OV 03/18/13, next OV 03/18/14. Pt requesting a refill on ritalin LA, last refill on 08-01-13 for #90. Rx printed and placed on CY cart to sign.  Pt aware rx mailed to her today. Williamson Bing, CMA

## 2013-11-10 ENCOUNTER — Telehealth: Payer: Self-pay | Admitting: Internal Medicine

## 2013-11-10 NOTE — Telephone Encounter (Signed)
Need to work with my routine schedule

## 2013-11-10 NOTE — Telephone Encounter (Signed)
Called spoke with pt. She reports she has been having problems with narcolepsy. She has a lot going on in life per pt and she is not sleeping at all. She wants to come in for OV and discuss all this with CDY and not over the phone. She wants to be worked into his schedule. Please advise thanks

## 2013-11-10 NOTE — Telephone Encounter (Signed)
Pt can come in Friday 11-11-13 at 1:30pm with CY. The spot is held for this patient. Thanks.

## 2013-11-10 NOTE — Telephone Encounter (Signed)
lmomtcb x1 for pt 

## 2013-11-11 NOTE — Telephone Encounter (Signed)
lmomtcb for pt 

## 2013-11-14 NOTE — Telephone Encounter (Signed)
LMTCBx3 on pt home/cell #. I ATC emergency contact # and it has been disconnected. Does the pt still need an appt?  [name], CMA

## 2013-11-15 NOTE — Telephone Encounter (Signed)
LMTCB-we can schedule patient in with CY routinely. Pt will ask for Pamela Hernandez so I may schedule this for her. Thanks.

## 2013-11-16 NOTE — Telephone Encounter (Signed)
LMTCB

## 2013-11-17 NOTE — Telephone Encounter (Signed)
lmomtcb for the pt numerous times.  Will sign off of this message and wait for the pt to return our call.

## 2013-11-21 ENCOUNTER — Telehealth: Payer: Self-pay | Admitting: Internal Medicine

## 2013-11-21 NOTE — Telephone Encounter (Signed)
Pt returned call

## 2013-11-21 NOTE — Telephone Encounter (Signed)
lmomtcb for pt 

## 2013-11-21 NOTE — Telephone Encounter (Signed)
Called, spoke with pt.  She is requesting appt to f/u on narcolepsy.  We have scheduled this for May 13 at 11 am with Dr. Annamaria Boots. Pt aware and voiced no further questions or concerns at this time.

## 2013-11-23 ENCOUNTER — Encounter: Payer: Self-pay | Admitting: Internal Medicine

## 2013-11-23 ENCOUNTER — Ambulatory Visit (INDEPENDENT_AMBULATORY_CARE_PROVIDER_SITE_OTHER): Payer: Managed Care, Other (non HMO) | Admitting: Internal Medicine

## 2013-11-23 VITALS — BP 126/78 | HR 84 | Ht 65.5 in | Wt 207.8 lb

## 2013-11-23 DIAGNOSIS — F19982 Other psychoactive substance use, unspecified with psychoactive substance-induced sleep disorder: Secondary | ICD-10-CM | POA: Insufficient documentation

## 2013-11-23 DIAGNOSIS — G4700 Insomnia, unspecified: Secondary | ICD-10-CM

## 2013-11-23 DIAGNOSIS — G47411 Narcolepsy with cataplexy: Secondary | ICD-10-CM

## 2013-11-23 MED ORDER — TEMAZEPAM 15 MG PO CAPS: 15.0000 mg | ORAL_CAPSULE | Freq: Every evening | ORAL | Status: DC | PRN

## 2013-11-23 MED ORDER — BENZONATATE 200 MG PO CAPS: 200.0000 mg | ORAL_CAPSULE | Freq: Three times a day (TID) | ORAL | Status: DC | PRN

## 2013-11-23 NOTE — Progress Notes (Signed)
Subjective:    Patient ID: Pamela Hernandez, female    DOB: August 13, 1950, 63 y.o.   MRN: [id number]  HPI 03/14/11- 63 year old female never smoker followed for narcolepsy/cataplexy, complicated by hypothyroidism, GERD. Last here August 08, 2010- Still using Ritalin LA- 2 and sometimes 3/day, with less frequent cataplexy. Sudden laughter is her trigger and she will need to sit quickly to avoid falling. She is satisfied with the ritalin dose. Eats small portion at lunch and naps in car before return to work.  She doesn't want to try imipramine or another daily med to prevent cataplexy. On higher dose thyroid hormone, in heat, her skin breaks out under her computer glasses.  She does snore. Sleeps on couch to avoid bothering husband who is also a poor sleeper.   03/12/12- 63 year old female never smoker followed for narcolepsy/cataplexy, complicated by hypothyroidism, GERD. Still staying tired; tries not to take all medications through the day because its causes her to not sleep. Insurance would like patient to get 3 month supply on Ritalin. Drowsy if passenger in car. Tired much of the day. Her employer allows her to put her head down briefly for a nap she tries not to take stimulant medications on weekends. Less cataplexy in past years, still triggered by hard laugh startled. 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 Media planner and Provigil caused depression. She denies palpitation, chest pain, syncope  03/18/13- 63 year old female never smoker followed for narcolepsy/cataplexy, complicated by hypothyroidism, GERD. FOLLOWS FOR:  Sleeping worse since last OV.  Sleeping aproximatly 4 hours per night.  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 Ritalin LA 20 mg twice daily. Reglan 10 mg occasionally. Naps helps some  11/23/13- 63 year old female never smoker followed for narcolepsy/cataplexy, complicated by hypothyroidism,  GERD. FOLLOWS FOR: Under more stress(husband's health issues; not sleeping well and stays sleepy during the day. Ritalin LA 20 mg, 1-3 as needed,    Ritalin 10 mg, 1 every 4 hours as needed Insurance had approved Ritalin 10 mg before, now wants just one strength, "exhausted". Husband getting difficult to work with- she ?'s dementia.  Can't sleep- stress. Incidental URI/ cough.  Review of Systems-see HPI Constitutional:   No-   weight loss, night sweats, fevers, chills, +fatigue, lassitude. HEENT:   No-  headaches, difficulty swallowing, tooth/dental problems, sore throat,       No-  sneezing, itching, ear ache, nasal congestion, post nasal drip,  CV:  No-   chest pain, orthopnea, PND, swelling in lower extremities, anasarca, dizziness, palpitations Resp: No-   shortness of breath with exertion or at rest.              No-   productive cough,  No non-productive cough,  No-  coughing up of blood.              No-   change in color of mucus.  No- wheezing.   Skin: No-  rash or lesions except as per history of present illness. GI:  No-   heartburn, indigestion, abdominal pain, nausea, vomiting,  GU: MS:  No-   joint pain or swelling.  Neuro- nothing unusual outside of history of present illness Psych:  No- change in mood or affect. + depression or anxiety.  No memory loss. Objective:   Physical Exam General- Alert, Oriented, Affect+tearful, Distress- none physical; overweight Skin- rash-over bridge of nose , lesions- none, excoriation- none Lymphadenopathy- none Head- atraumatic  Eyes- Gross vision intact, PERRLA, conjunctivae clear secretions            Ears- Hearing, canals- normal            Nose- Sniffing, No- Septal dev, mucus, polyps, erosion, perforation             Throat- Mallampati II-III , mucosa clear , drainage- none, tonsils- atrophic Neck- flexible , trachea midline, no stridor , thyroid nl, carotid no bruit Chest - symmetrical excursion , unlabored            Heart/CV- RRR , no murmur , no gallop  , no rub, nl s1 s2                           - JVD- none , edema- none, stasis changes- none, varices- none           Lung- clear to P&A, wheeze- none, cough- none , dullness-none, rub- none           Chest wall-  Abd-  Br/ Gen/ Rectal- Not done, not indicated Extrem- cyanosis- none, clubbing, none, atrophy- none, strength- nl Neuro- grossly intact to observation  Assessment & Plan:

## 2013-11-23 NOTE — Assessment & Plan Note (Signed)
Magnified by stress/ sleep loss. Ok to continue present Ritalins.

## 2013-11-23 NOTE — Assessment & Plan Note (Signed)
Interrupted night-time sleep is often seen with narcolepsy, but major current factor is stress, with need to watch for husband. Plan- temazepam. She is to discuss her husband's care with Dr Minna Antis- perhaps neuropsych referral.

## 2013-11-23 NOTE — Patient Instructions (Addendum)
Script for temazepam 15 1-2 at bedtime as needed for sleep  Call for Ritalin refills as needed  Please ask Dr Minna Antis for help with getting your husband the help he needs. Ask about referral to a neurologist for dementia evaluation if that is appropriate  Script for benzonatate perles for cough

## 2013-12-12 ENCOUNTER — Telehealth: Payer: Self-pay | Admitting: Internal Medicine

## 2013-12-12 NOTE — Telephone Encounter (Signed)
lmomtcb x1 Which dosage is pt needing?

## 2013-12-13 MED ORDER — METHYLPHENIDATE HCL ER (LA) 20 MG PO CP24: ORAL_CAPSULE | ORAL | Status: DC

## 2013-12-13 NOTE — Telephone Encounter (Signed)
Last Rx 10-25-13, last OV 11-23-13. Rx printed. Pt aware ok to pick up later today. [place] Bing, CMA

## 2013-12-21 ENCOUNTER — Ambulatory Visit: Payer: Managed Care, Other (non HMO) | Admitting: Pulmonary Disease

## 2014-01-03 ENCOUNTER — Encounter: Payer: Self-pay | Admitting: Internal Medicine

## 2014-01-10 ENCOUNTER — Telehealth: Payer: Self-pay | Admitting: Internal Medicine

## 2014-01-10 MED ORDER — METHYLPHENIDATE HCL ER (LA) 20 MG PO CP24: ORAL_CAPSULE | ORAL | Status: DC

## 2014-01-10 NOTE — Telephone Encounter (Signed)
rx has been printed out and placed on CY cart to be signed and we will call her once this is ready to be picked up.

## 2014-01-10 NOTE — Telephone Encounter (Signed)
Rx signed by CY and placed at front for pick up. I left message on patients number provided that Rx ready for pick up.

## 2014-01-10 NOTE — Telephone Encounter (Signed)
Last seen 11/23/13 Upcoming appt 03/02/14 Requesting 90-day supply Ritalin 20mg  Pt will pick up once rx ready Please advise Dr Annamaria Boots. Thanks.

## 2014-01-10 NOTE — Telephone Encounter (Signed)
Ok to refill 

## 2014-02-21 ENCOUNTER — Telehealth: Payer: Self-pay | Admitting: Internal Medicine

## 2014-02-21 MED ORDER — METHYLPHENIDATE HCL ER (LA) 20 MG PO CP24: ORAL_CAPSULE | ORAL | Status: DC

## 2014-02-21 NOTE — Telephone Encounter (Signed)
Ok , # 270, 1-3 daily as directed (90 day Rx)

## 2014-02-21 NOTE — Telephone Encounter (Signed)
Rx signed by CY and ready for patient to pick up. I had to leave a message for patient on her voicemail. Rx at front.

## 2014-02-21 NOTE — Telephone Encounter (Signed)
Called spoke with pt. She is asking for her rx ritalin LA 20 mg 1-3 daily as needed for 90 day supply instead of 30 day supply. Last refilled 01/10/14 #90 Please advise Dr. Annamaria Boots thanks

## 2014-02-21 NOTE — Telephone Encounter (Signed)
rx has been printed out and placed on CY cart to be signed.  Will call the pt once this is ready to be picked up.

## 2014-02-24 ENCOUNTER — Ambulatory Visit: Payer: Managed Care, Other (non HMO) | Admitting: Internal Medicine

## 2014-03-02 ENCOUNTER — Encounter: Payer: Self-pay | Admitting: Internal Medicine

## 2014-03-02 ENCOUNTER — Ambulatory Visit (INDEPENDENT_AMBULATORY_CARE_PROVIDER_SITE_OTHER): Payer: Managed Care, Other (non HMO) | Admitting: Internal Medicine

## 2014-03-02 VITALS — BP 124/82 | HR 100 | Ht 65.5 in | Wt 207.0 lb

## 2014-03-02 DIAGNOSIS — G47411 Narcolepsy with cataplexy: Secondary | ICD-10-CM

## 2014-03-02 NOTE — Patient Instructions (Signed)
We can continue Ritalin and naps as you are doing.   Please call as needed

## 2014-03-02 NOTE — Progress Notes (Signed)
Subjective:    Patient ID: Pamela Hernandez, female    DOB: 01-21-1951, 63 y.o.   MRN: [id number]  HPI 03/14/11- 63 year old female never smoker followed for narcolepsy/cataplexy, complicated by hypothyroidism, GERD. Last here August 08, 2010- Still using Ritalin LA- 2 and sometimes 3/day, with less frequent cataplexy. Sudden laughter is her trigger and she will need to sit quickly to avoid falling. She is satisfied with the ritalin dose. Eats small portion at lunch and naps in car before return to work.  She doesn't want to try imipramine or another daily med to prevent cataplexy. On higher dose thyroid hormone, in heat, her skin breaks out under her computer glasses.  She does snore. Sleeps on couch to avoid bothering husband who is also a poor sleeper.   03/12/12- 63 year old female never smoker followed for narcolepsy/cataplexy, complicated by hypothyroidism, GERD. Still staying tired; tries not to take all medications through the day because its causes her to not sleep. Insurance would like patient to get 3 month supply on Ritalin. Drowsy if passenger in car. Tired much of the day. Her employer allows her to put her head down briefly for a nap she tries not to take stimulant medications on weekends. Less cataplexy in past years, still triggered by hard laugh startled. 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 Media planner and Provigil caused depression. She denies palpitation, chest pain, syncope  03/18/13- 63 year old female never smoker followed for narcolepsy/cataplexy, complicated by hypothyroidism, GERD. FOLLOWS FOR:  Sleeping worse since last OV.  Sleeping aproximatly 4 hours per night.  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 Ritalin LA 20 mg twice daily. Reglan 10 mg occasionally. Naps helps some  11/23/13- 63 year old female never smoker followed for narcolepsy/cataplexy, complicated by hypothyroidism,  GERD. FOLLOWS FOR: Under more stress(husband's health issues; not sleeping well and stays sleepy during the day. Ritalin LA 20 mg, 1-3 as needed,    Ritalin 10 mg, 1 every 4 hours as needed Insurance had approved Ritalin 10 mg before, now wants just one strength, "exhausted". Husband getting difficult to work with- she ?'s dementia.  Can't sleep- stress. Incidental URI/ cough.  03/02/14- 63 year old female never smoker followed for narcolepsy/cataplexy,Insomnia,  complicated by hypothyroidism, GERD. FOLLOWS FOR:  Pt states that stress level has improved--pt is able to sleep more during the night and is having decreased moments of sleepiness throughout day.   Review of Systems-see HPI Constitutional:   No-   weight loss, night sweats, fevers, chills, +fatigue, lassitude. HEENT:   No-  headaches, difficulty swallowing, tooth/dental problems, sore throat,       No-  sneezing, itching, ear ache, nasal congestion, post nasal drip,  CV:  No-   chest pain, orthopnea, PND, swelling in lower extremities, anasarca, dizziness, palpitations Resp: No-   shortness of breath with exertion or at rest.              No-   productive cough,  No non-productive cough,  No-  coughing up of blood.              No-   change in color of mucus.  No- wheezing.   Skin: No-  rash or lesions except as per history of present illness. GI:  No-   heartburn, indigestion, abdominal pain, nausea, vomiting,  GU: MS:  No-   joint pain or swelling.  Neuro- nothing unusual outside of history of present illness Psych:  No- change in mood or affect. + depression or anxiety.  No memory loss. Objective:   Physical Exam General- Alert, Oriented, Affect+tearful, Distress- none physical; overweight Skin- rash-over bridge of nose , lesions- none, excoriation- none Lymphadenopathy- none Head- atraumatic            Eyes- Gross vision intact, PERRLA, conjunctivae clear secretions            Ears- Hearing, canals- normal             Nose- Sniffing, No- Septal dev, mucus, polyps, erosion, perforation             Throat- Mallampati II-III , mucosa clear , drainage- none, tonsils- atrophic Neck- flexible , trachea midline, no stridor , thyroid nl, carotid no bruit Chest - symmetrical excursion , unlabored           Heart/CV- RRR , no murmur , no gallop  , no rub, nl s1 s2                           - JVD- none , edema- none, stasis changes- none, varices- none           Lung- clear to P&A, wheeze- none, cough- none , dullness-none, rub- none           Chest wall-  Abd-  Br/ Gen/ Rectal- Not done, not indicated Extrem- cyanosis- none, clubbing, none, atrophy- none, strength- nl Neuro- grossly intact to observation  Assessment & Plan:

## 2014-06-01 ENCOUNTER — Telehealth: Payer: Self-pay | Admitting: Internal Medicine

## 2014-06-02 MED ORDER — METHYLPHENIDATE HCL ER (LA) 20 MG PO CP24: ORAL_CAPSULE | ORAL | Status: DC

## 2014-06-02 NOTE — Telephone Encounter (Signed)
Pt requesting refill for 90 day supply of Ritalin LA 20 mg.  Last filled 03/02/14 for # 270 tabs.  Please advise.  Current Outpatient Prescriptions on File Prior to Visit  Medication Sig Dispense Refill  . benzonatate (TESSALON) 200 MG capsule Take 1 capsule (200 mg total) by mouth 3 (three) times daily as needed for cough. 30 capsule 1  . levothyroxine (SYNTHROID, LEVOTHROID) 75 MCG tablet 75 mcg daily 30 tablet prn  . methylphenidate (RITALIN LA) 20 MG 24 hr capsule 1-3 daily as needed 270 capsule 0  . pantoprazole (PROTONIX) 40 MG tablet Take 1 tablet by mouth daily.    . temazepam (RESTORIL) 15 MG capsule Take 1 capsule (15 mg total) by mouth at bedtime as needed for sleep. 30 capsule 2   No current facility-administered medications on file prior to visit.

## 2014-06-02 NOTE — Telephone Encounter (Signed)
Ok to refill as requested 

## 2014-06-02 NOTE — Telephone Encounter (Signed)
lmtcb for pt.  Rx signed and placed up front for pick up.

## 2014-06-02 NOTE — Telephone Encounter (Signed)
lmtcb for pt.  

## 2014-06-05 NOTE — Telephone Encounter (Signed)
Called and spoke with pt and she stated that her husband came by today to pick this up

## 2014-06-21 ENCOUNTER — Other Ambulatory Visit: Payer: Self-pay

## 2014-06-21 DIAGNOSIS — Z1231 Encounter for screening mammogram for malignant neoplasm of breast: Secondary | ICD-10-CM

## 2014-07-24 ENCOUNTER — Ambulatory Visit
Admission: RE | Admit: 2014-07-24 | Discharge: 2014-07-24 | Disposition: A | Discharge status: Home / Self Care | Payer: Managed Care, Other (non HMO) | Source: Ambulatory Visit

## 2014-07-24 DIAGNOSIS — Z1231 Encounter for screening mammogram for malignant neoplasm of breast: Secondary | ICD-10-CM

## 2014-09-07 ENCOUNTER — Ambulatory Visit: Payer: Managed Care, Other (non HMO) | Admitting: Internal Medicine

## 2014-11-10 ENCOUNTER — Telehealth: Payer: Self-pay | Admitting: Internal Medicine

## 2014-11-10 NOTE — Telephone Encounter (Signed)
lmtcb for pt.  

## 2014-11-13 NOTE — Telephone Encounter (Signed)
lmtcb x1 for pt. 

## 2014-11-13 NOTE — Telephone Encounter (Signed)
LM (detailed) advising patient to call back and speak with Nurse.  Nurse has to verifiy Rx needed before printing to refill.

## 2014-11-13 NOTE — Telephone Encounter (Signed)
Pt wants to pick up this rx in the AM.  Please leave a detailed message on machine.  [phone]

## 2014-11-13 NOTE — Telephone Encounter (Signed)
LMTCb x 2 for pt

## 2014-11-13 NOTE — Telephone Encounter (Signed)
Pt called back. Pt is upset that she can never get an answer from the nurse. Pt will be up here tomorrow morning so she can talk to someone to get this rx. She says she has never had a problem getting this rx for 20 years until now.

## 2014-11-14 MED ORDER — METHYLPHENIDATE HCL ER (LA) 20 MG PO CP24: ORAL_CAPSULE | ORAL | Status: DC

## 2014-11-14 NOTE — Telephone Encounter (Signed)
Rx printed and signed by Dr Annamaria Boots and given to pt.

## 2014-11-14 NOTE — Telephone Encounter (Signed)
Pt is here to see nurse about her Rx.

## 2015-01-22 ENCOUNTER — Telehealth: Payer: Self-pay | Admitting: Internal Medicine

## 2015-01-22 MED ORDER — METHYLPHENIDATE HCL ER (LA) 20 MG PO CP24: ORAL_CAPSULE | ORAL | Status: DC

## 2015-01-22 NOTE — Telephone Encounter (Signed)
Sorry no openings then; CY is okay with patient coming for OV when his next open slot is. Thanks.

## 2015-01-22 NOTE — Telephone Encounter (Signed)
Last OV 03/02/14 No pending OV Last refill 11/14/14 #270  CY - please advise on refill. Thanks.

## 2015-01-22 NOTE — Telephone Encounter (Signed)
Ok to refill as requested, but remind her that we need to see her once a year with controlled meds. Please make next available routine ov.

## 2015-01-22 NOTE — Telephone Encounter (Signed)
Rx printed for Ritalin.  Patient will pick up in the morning.  Left at front to pick up.   Patient would like to schedule appointment after 2nd week of August. Katie - where can I put patient on the schedule?  Please advise.

## 2015-01-23 NOTE — Telephone Encounter (Signed)
Pt aware that her Rx is ready to be picked up.  Pt states that she has not been able to get the full 90-day supply(#270 caps) in a long time. Pharmacy has been supplying her with only at 30-day supply(90 caps) d/t the way the Rx is written and the type of pills they are. Her Rx is written for her to take Ritalin 20mg  24HR capsules -- they are unable to get 90-day supply (#270) from their outside supplier because of this. Patient is wanting to know if there is another type she can be written for seeing that they can never supply her with the correct amount listed on the Rx. Patient states that she has tried multiple pharmacies and no one can get her the correct supply and it is all d/t the type of pill and the instructions of use.  Pt states that she has been taking 1-2 pills only a day and had been having to take naps during the day so that she does not lose her job.  Pt states that the last time she filled her Rx was back in May 2016 and she has stretched that Rx out until now and has about a 15 day supply left.  Pt scheduled for OV with CY May 14, 2015 at 3:30 for ROV  Will send to CY to advise on RX and appt date given.   Patient requests that we leave a detailed message on machine when return call.    Medication List       This list is accurate as of: 01/22/15 11:59 PM.  Always use your most recent med list.               benzonatate 200 MG capsule  Commonly known as:  TESSALON  Take 1 capsule (200 mg total) by mouth 3 (three) times daily as needed for cough.     levothyroxine 75 MCG tablet  Commonly known as:  SYNTHROID, LEVOTHROID  75 mcg daily     methylphenidate 20 MG 24 hr capsule  Commonly known as:  RITALIN LA  1-3 daily as needed     pantoprazole 40 MG tablet  Commonly known as:  PROTONIX  Take 1 tablet by mouth daily.     temazepam 15 MG capsule  Commonly known as:  RESTORIL  Take 1 capsule (15 mg total) by mouth at bedtime as needed for sleep.       Allergies   Allergen Reactions  . Shellfish Allergy     Throat swelling

## 2015-01-23 NOTE — Telephone Encounter (Signed)
Pt returned call - [phone]. Can leave a voicemail.

## 2015-01-23 NOTE — Telephone Encounter (Signed)
lmtcb for pt.  

## 2015-01-24 NOTE — Telephone Encounter (Signed)
LM for patient to return call to discuss med change.  Need to know if patient has filled other Rx on file yet.

## 2015-01-24 NOTE — Telephone Encounter (Signed)
Try changing script to methylphenidate 20 mg (no ER)     # 270, 1 three times daily as needed, no refill      For 3 month supply.

## 2015-01-25 NOTE — Telephone Encounter (Signed)
lmomtcb x2 for pt 

## 2015-01-26 NOTE — Telephone Encounter (Signed)
lmtcb x3 for pt. 

## 2015-01-29 NOTE — Telephone Encounter (Signed)
Lm X4 for pt.  Will close per triage protocol

## 2015-05-14 ENCOUNTER — Ambulatory Visit (INDEPENDENT_AMBULATORY_CARE_PROVIDER_SITE_OTHER): Payer: Managed Care, Other (non HMO) | Admitting: Internal Medicine

## 2015-05-14 ENCOUNTER — Encounter: Payer: Self-pay | Admitting: Internal Medicine

## 2015-05-14 VITALS — BP 122/80 | HR 78 | Ht 65.5 in | Wt 207.0 lb

## 2015-05-14 DIAGNOSIS — G47411 Narcolepsy with cataplexy: Secondary | ICD-10-CM

## 2015-05-14 DIAGNOSIS — K219 Gastro-esophageal reflux disease without esophagitis: Secondary | ICD-10-CM | POA: Diagnosis not present

## 2015-05-14 MED ORDER — METHYLPHENIDATE HCL ER (LA) 20 MG PO CP24: ORAL_CAPSULE | ORAL | Status: DC

## 2015-05-14 NOTE — Patient Instructions (Signed)
We can continue the methylphenidate as before. I have taken temazepam off your list since you never use it.  Naps are always good  Consider that suggestion of raising the head of your bedframe on a brick to minimize reflux at night

## 2015-05-14 NOTE — Progress Notes (Signed)
Subjective:    Patient ID: Pamela Hernandez, female    DOB: January 27, 1951, 64 y.o.   MRN: [id number]  HPI 03/14/11- 64 year old female never smoker followed for narcolepsy/cataplexy, complicated by hypothyroidism, GERD. Last here August 08, 2010- Still using Ritalin LA- 2 and sometimes 3/day, with less frequent cataplexy. Sudden laughter is her trigger and she will need to sit quickly to avoid falling. She is satisfied with the ritalin dose. Eats small portion at lunch and naps in car before return to work.  She doesn't want to try imipramine or another daily med to prevent cataplexy. On higher dose thyroid hormone, in heat, her skin breaks out under her computer glasses.  She does snore. Sleeps on couch to avoid bothering husband who is also a poor sleeper.   03/12/12- 64 year old female never smoker followed for narcolepsy/cataplexy, complicated by hypothyroidism, GERD. Still staying tired; tries not to take all medications through the day because its causes her to not sleep. Insurance would like patient to get 3 month supply on Ritalin. Drowsy if passenger in car. Tired much of the day. Her employer allows her to put her head down briefly for a nap she tries not to take stimulant medications on weekends. Less cataplexy in past years, still triggered by hard laugh startled. 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 Media planner and Provigil caused depression. She denies palpitation, chest pain, syncope  03/18/13- 64 year old female never smoker followed for narcolepsy/cataplexy, complicated by hypothyroidism, GERD. FOLLOWS FOR:  Sleeping worse since last OV.  Sleeping aproximatly 4 hours per night.  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 Ritalin LA 20 mg twice daily. Ritallin 10 mg occasionally. Naps helps some  11/23/13- 64 year old female never smoker followed for narcolepsy/cataplexy, complicated by hypothyroidism,  GERD. FOLLOWS FOR: Under more stress(husband's health issues; not sleeping well and stays sleepy during the day. Ritalin LA 20 mg, 1-3 as needed,    Ritalin 10 mg, 1 every 4 hours as needed Insurance had approved Ritalin 10 mg before, now wants just one strength, "exhausted". Husband getting difficult to work with- she ?'s dementia.  Can't sleep- stress. Incidental URI/ cough.  03/02/14- 64 year old female never smoker followed for narcolepsy/cataplexy,Insomnia,  complicated by hypothyroidism, GERD. FOLLOWS FOR:  Pt states that stress level has improved--pt is able to sleep more during the night and is having decreased moments of sleepiness throughout day.  05/14/15- 64 year old female never smoker followed for narcolepsy/cataplexy,Insomnia,  complicated by hypothyroidism, GERD FOLLOWS FOR: Pt has been under more stress lately; stays tired as well. Refers to stresses associated with work and her husband's health status. Declined flu vaccine Takes Ritalin 20 mg around 7:30 AM and again early afternoon. Often naps in the morning and sometimes in afternoon. Sleeps adequately at night and never did try temazepam. Recognizes occasional reflux as basis for dry cough  Review of Systems-see HPI Constitutional:   No-   weight loss, night sweats, fevers, chills, +fatigue, lassitude. HEENT:   No-  headaches, difficulty swallowing, tooth/dental problems, sore throat,       No-  sneezing, itching, ear ache, nasal congestion, post nasal drip,  CV:  No-   chest pain, orthopnea, PND, swelling in lower extremities, anasarca, dizziness, palpitations Resp: No-   shortness of breath with exertion or at rest.              No-   productive cough,  No non-productive cough,  No-  coughing up of blood.              No-   change in color of mucus.  No- wheezing.   Skin: No-  rash or lesions except as per history of present illness. GI:  No-   heartburn, indigestion, abdominal pain, nausea, vomiting,  GU: MS:  No-    joint pain or swelling.  Neuro- nothing unusual outside of history of present illness Psych:  No- change in mood or affect. + depression or anxiety.  No memory loss. Objective:   Physical Exam General- Alert, Oriented, Affect+tearful, Distress- none physical; overweight Skin- rash-over bridge of nose , lesions- none, excoriation- none Lymphadenopathy- none Head- atraumatic            Eyes- Gross vision intact, PERRLA, conjunctivae clear secretions            Ears- Hearing, canals- normal            Nose- Sniffing, No- Septal dev, mucus, polyps, erosion, perforation             Throat- Mallampati II-III , mucosa clear , drainage- none, tonsils- atrophic Neck- flexible , trachea midline, no stridor , thyroid nl, carotid no bruit Chest - symmetrical excursion , unlabored           Heart/CV- RRR , no murmur , no gallop  , no rub, nl s1 s2                           - JVD- none , edema- none, stasis changes- none, varices- none           Lung- clear to P&A, wheeze- none, cough- none , dullness-none, rub- none           Chest wall-  Abd-  Br/ Gen/ Rectal- Not done, not indicated Extrem- cyanosis- none, clubbing, none, atrophy- none, strength- nl Neuro- grossly intact to observation  Assessment & Plan:

## 2015-05-15 NOTE — Assessment & Plan Note (Signed)
Associates occasional reflux with dry cough. Discussed reflux precautions including elevating head of bed. Suggest she review with her primary physician

## 2015-05-15 NOTE — Assessment & Plan Note (Signed)
Very little cataplexy now. Sleeps okay at night without temazepam. Better control in recent years with Ritalin and naps. She has used medication as instructed without abuse or concerns.

## 2015-09-07 ENCOUNTER — Other Ambulatory Visit: Payer: Self-pay

## 2015-09-07 DIAGNOSIS — Z1231 Encounter for screening mammogram for malignant neoplasm of breast: Secondary | ICD-10-CM

## 2015-09-27 ENCOUNTER — Ambulatory Visit: Payer: Managed Care, Other (non HMO)

## 2015-10-10 ENCOUNTER — Telehealth: Payer: Self-pay | Admitting: Internal Medicine

## 2015-10-10 MED ORDER — METHYLPHENIDATE HCL ER (LA) 20 MG PO CP24: ORAL_CAPSULE | ORAL | Status: DC

## 2015-10-10 NOTE — Telephone Encounter (Signed)
Ritalin rx printed and placed on CY's cart for signature.

## 2015-10-10 NOTE — Telephone Encounter (Signed)
Ok to refill 

## 2015-10-10 NOTE — Telephone Encounter (Signed)
Pt is needing a refill on Ritalin 20mg . Last OV was on 05/14/15. Has a pending OV on 05/14/16. Last refill of Ritalin 20mg  was on 05/14/15 for 1-3 tabs daily prn #270.  CY - please advise on refill. Thanks.

## 2015-10-11 NOTE — Telephone Encounter (Signed)
Ok to refill like last time

## 2015-10-11 NOTE — Telephone Encounter (Signed)
Spoke with pt. She is aware that we will be placing this rx in the mail to her today. Nothing further was needed.

## 2015-10-30 ENCOUNTER — Ambulatory Visit
Admission: RE | Admit: 2015-10-30 | Discharge: 2015-10-30 | Disposition: A | Discharge status: Home / Self Care | Payer: Managed Care, Other (non HMO) | Source: Ambulatory Visit

## 2015-10-30 DIAGNOSIS — Z1231 Encounter for screening mammogram for malignant neoplasm of breast: Secondary | ICD-10-CM

## 2015-11-01 ENCOUNTER — Other Ambulatory Visit: Payer: Self-pay | Admitting: Internal Medicine

## 2015-11-01 DIAGNOSIS — R928 Other abnormal and inconclusive findings on diagnostic imaging of breast: Secondary | ICD-10-CM

## 2015-11-02 ENCOUNTER — Ambulatory Visit: Payer: Managed Care, Other (non HMO)

## 2015-11-07 ENCOUNTER — Ambulatory Visit
Admission: RE | Admit: 2015-11-07 | Discharge: 2015-11-07 | Disposition: A | Discharge status: Home / Self Care | Payer: Managed Care, Other (non HMO) | Source: Ambulatory Visit | Attending: Internal Medicine | Admitting: Internal Medicine

## 2015-11-07 DIAGNOSIS — R928 Other abnormal and inconclusive findings on diagnostic imaging of breast: Secondary | ICD-10-CM

## 2015-11-08 ENCOUNTER — Other Ambulatory Visit: Payer: Managed Care, Other (non HMO)

## 2015-11-09 ENCOUNTER — Other Ambulatory Visit: Payer: Managed Care, Other (non HMO)

## 2016-01-16 ENCOUNTER — Telehealth: Payer: Self-pay | Admitting: Internal Medicine

## 2016-01-16 MED ORDER — METHYLPHENIDATE HCL ER (LA) 20 MG PO CP24: ORAL_CAPSULE | ORAL | Status: DC

## 2016-01-16 NOTE — Telephone Encounter (Signed)
Rx has been signed and placed up front for pick up. Pt is aware. Nothing further was needed.

## 2016-01-16 NOTE — Telephone Encounter (Signed)
Ok to refill for 90 days  

## 2016-01-16 NOTE — Telephone Encounter (Signed)
Pt is requesting a refill on Ritalin 20mg  >> 90 day supply. Last OV was 05/14/15. Has pending OV on 05/14/16. Last refill was on 10/10/15 #270 (90 day supply).  CY - please advise on refill.  Thanks.

## 2016-05-14 ENCOUNTER — Ambulatory Visit (INDEPENDENT_AMBULATORY_CARE_PROVIDER_SITE_OTHER): Payer: BLUE CROSS/BLUE SHIELD | Admitting: Internal Medicine

## 2016-05-14 ENCOUNTER — Encounter: Payer: Self-pay | Admitting: Internal Medicine

## 2016-05-14 DIAGNOSIS — G47411 Narcolepsy with cataplexy: Secondary | ICD-10-CM

## 2016-05-14 DIAGNOSIS — F19982 Other psychoactive substance use, unspecified with psychoactive substance-induced sleep disorder: Secondary | ICD-10-CM | POA: Diagnosis not present

## 2016-05-14 NOTE — Patient Instructions (Signed)
Ok to continue present meds. Call for refill when needed.  Please call as needed

## 2016-05-14 NOTE — Assessment & Plan Note (Signed)
She is stable, using naps and Ritalin appropriately. She accepts responsibility to drive safely. Continued emphasis on good sleep hygiene. Medication refills were needed.

## 2016-05-14 NOTE — Progress Notes (Signed)
Subjective:    Patient ID: Pamela Hernandez, female    DOB: 21-Jan-1951, 65 y.o.   MRN: [id number]  HPI Female never smoker followed for narcolepsy/cataplexy,Insomnia,  complicated by hypothyroidism, GERD.  03/02/14- 65 year old female never smoker followed for narcolepsy/cataplexy,Insomnia,  complicated by hypothyroidism, GERD. FOLLOWS FOR:  Pt states that stress level has improved--pt is able to sleep more during the night and is having decreased moments of sleepiness throughout day.  05/14/15- 65 year old female never smoker followed for narcolepsy/cataplexy,Insomnia,  complicated by hypothyroidism, GERD FOLLOWS FOR: Pt has been under more stress lately; stays tired as well. Refers to stresses associated with work and her husband's health status. Declined flu vaccine Takes Ritalin 20 mg around 7:30 AM and again early afternoon. Often naps in the morning and sometimes in afternoon. Sleeps adequately at night and never did try temazepam. Recognizes occasional reflux as basis for dry cough  05/14/2016-65 year old female never smoker followed for narcolepsy/cataplexy, insomnia, complicated by hypothyroidism, GERD FOLLOWS FOR: Pt has retired now and is able to take as many naps during the day. Feels adequately controlled using Ritalin one or 2 tablets a day now, never 3. No palpitations, blood pressure problems or other overstimulation. Still frequently wakes at night, typical of narcolepsy. Not wanting sleep medication. We discussed sleep hygiene and expectations.  Review of Systems-see HPI Constitutional:   No-   weight loss, night sweats, fevers, chills, +fatigue, lassitude. HEENT:   No-  headaches, difficulty swallowing, tooth/dental problems, sore throat,       No-  sneezing, itching, ear ache, nasal congestion, post nasal drip,  CV:  No-   chest pain, orthopnea, PND, swelling in lower extremities, anasarca, dizziness, palpitations Resp: No-   shortness of breath with exertion or at rest.               No-   productive cough,  No non-productive cough,  No-  coughing up of blood.              No-   change in color of mucus.  No- wheezing.   Skin: No-  rash or lesions except as per history of present illness. GI:  No-   heartburn, indigestion, abdominal pain, nausea, vomiting,  GU: MS:  No-   joint pain or swelling.  Neuro- nothing unusual outside of history of present illness Psych:  No- change in mood or affect. + depression or anxiety.  No memory loss. Objective:   Physical Exam General- Alert, Oriented, Affect- nl, Distress- none physical; overweight Skin- rash-over bridge of nose , lesions- none, excoriation- none Lymphadenopathy- none Head- atraumatic            Eyes- Gross vision intact, PERRLA, conjunctivae clear secretions            Ears- Hearing, canals- normal            Nose- Sniffing, No- Septal dev, mucus, polyps, erosion, perforation             Throat- Mallampati II-III , mucosa clear , drainage- none, tonsils- atrophic Neck- flexible , trachea midline, no stridor , thyroid nl, carotid no bruit Chest - symmetrical excursion , unlabored           Heart/CV- RRR , no murmur , no gallop  , no rub, nl s1 s2                           - JVD- none , edema- none, stasis changes-  none, varices- none           Lung- clear to P&A, wheeze- none, cough- none , dullness-none, rub- none           Chest wall-  Abd-  Br/ Gen/ Rectal- Not done, not indicated Extrem- cyanosis- none, clubbing, none, atrophy- none, strength- nl Neuro- grossly intact to observation  Assessment & Plan:

## 2016-05-14 NOTE — Assessment & Plan Note (Signed)
Insomnia is common feature of the sleep-wake disturbance of narcolepsy. She is coping with it well and avoiding carryover of stimulant medication into the night time. She does not want a sleep medication.

## 2016-10-27 ENCOUNTER — Other Ambulatory Visit: Payer: Self-pay | Admitting: Internal Medicine

## 2016-10-27 DIAGNOSIS — Z1231 Encounter for screening mammogram for malignant neoplasm of breast: Secondary | ICD-10-CM

## 2016-11-14 ENCOUNTER — Encounter: Payer: Self-pay | Admitting: Radiology

## 2016-11-14 ENCOUNTER — Ambulatory Visit
Admission: RE | Admit: 2016-11-14 | Discharge: 2016-11-14 | Disposition: A | Discharge status: Home / Self Care | Payer: BLUE CROSS/BLUE SHIELD | Source: Ambulatory Visit | Attending: Internal Medicine | Admitting: Internal Medicine

## 2016-11-14 DIAGNOSIS — Z1231 Encounter for screening mammogram for malignant neoplasm of breast: Secondary | ICD-10-CM

## 2016-11-27 ENCOUNTER — Other Ambulatory Visit: Payer: Self-pay | Admitting: Otolaryngology

## 2016-11-27 DIAGNOSIS — H9122 Sudden idiopathic hearing loss, left ear: Secondary | ICD-10-CM

## 2016-12-15 ENCOUNTER — Other Ambulatory Visit: Payer: BLUE CROSS/BLUE SHIELD

## 2017-05-14 ENCOUNTER — Encounter: Payer: Self-pay | Admitting: Internal Medicine

## 2017-05-14 ENCOUNTER — Ambulatory Visit (INDEPENDENT_AMBULATORY_CARE_PROVIDER_SITE_OTHER): Payer: PPO | Admitting: Internal Medicine

## 2017-05-14 DIAGNOSIS — G4709 Other insomnia: Secondary | ICD-10-CM

## 2017-05-14 DIAGNOSIS — G47411 Narcolepsy with cataplexy: Secondary | ICD-10-CM | POA: Diagnosis not present

## 2017-05-14 MED ORDER — METHYLPHENIDATE HCL ER (LA) 20 MG PO CP24: ORAL_CAPSULE | ORAL | 0 refills | Status: DC

## 2017-05-14 NOTE — Progress Notes (Signed)
Subjective:    Patient ID: Pamela Hernandez, female    DOB: [birth year], 66 y.o.   MRN: [id number]  HPI Female never smoker followed for narcolepsy/cataplexy,Insomnia,  complicated by hypothyroidism, GERD.   -----------------------------------------------------------------------------------  03/02/14- 66 year old female never smoker followed for narcolepsy/cataplexy,Insomnia,  complicated by hypothyroidism, GERD. FOLLOWS FOR:  Pt states that stress level has improved--pt is able to sleep more during the night and is having decreased moments of sleepiness throughout day.  05/14/15- 66 year old female never smoker followed for narcolepsy/cataplexy,Insomnia,  complicated by hypothyroidism, GERD FOLLOWS FOR: Pt has been under more stress lately; stays tired as well. Refers to stresses associated with work and her husband's health status. Declined flu vaccine Takes Ritalin 20 mg around 7:30 AM and again early afternoon. Often naps in the morning and sometimes in afternoon. Sleeps adequately at night and never did try temazepam. Recognizes occasional reflux as basis for dry cough  05/14/2016-66 year old female never smoker followed for narcolepsy/cataplexy, insomnia, complicated by hypothyroidism, GERD FOLLOWS FOR: Pt has retired now and is able to take as many naps during the day. Feels adequately controlled using Ritalin one or 2 tablets a day now, never 3. No palpitations, blood pressure problems or other overstimulation. Still frequently wakes at night, typical of narcolepsy. Not wanting sleep medication. We discussed sleep hygiene and expectations.  05/14/17- 66 year old female never smoker followed for narcolepsy/cataplexy, insomnia, complicated by hypothyroidism, GERD For tired and naps "whenever I can".  Sleeps poorly at night.  Has a sleep medication she has never chosen to try.  Uses Ritalin 20 mg LA, 1 or 2 daily, rarely 3.  Says she is not snoring much.  No cataplexy symptoms.  Review  of Systems-see HPI + = positive Constitutional:   No-   weight loss, night sweats, fevers, chills, +fatigue, lassitude. HEENT:   No-  headaches, difficulty swallowing, tooth/dental problems, sore throat,       No-  sneezing, itching, ear ache, nasal congestion, post nasal drip,  CV:  No-   chest pain, orthopnea, PND, swelling in lower extremities, anasarca, dizziness, palpitations Resp: No-   shortness of breath with exertion or at rest.              No-   productive cough,  No non-productive cough,  No-  coughing up of blood.              No-   change in color of mucus.  No- wheezing.   Skin: No-  rash or lesions except as per history of present illness. GI:  No-   heartburn, indigestion, abdominal pain, nausea, vomiting,  GU: MS:  No-   joint pain or swelling.  Neuro- nothing unusual outside of history of present illness Psych:  No- change in mood or affect. + depression or anxiety.  No memory loss. Objective:   Physical Exam General- Alert, Oriented, Affect- nl, Distress- none physical; + overweight Skin- rash-over bridge of nose , lesions- none, excoriation- none Lymphadenopathy- none Head- atraumatic            Eyes- Gross vision intact, PERRLA, conjunctivae clear secretions            Ears- Hearing, canals- normal            Nose- Sniffing, No- Septal dev, mucus, polyps, erosion, perforation             Throat- Mallampati II-III , mucosa clear , drainage- none, tonsils- atrophic Neck- flexible , trachea midline, no stridor , thyroid nl, carotid  no bruit Chest - symmetrical excursion , unlabored           Heart/CV- RRR , no murmur , no gallop  , no rub, nl s1 s2                           - JVD- none , edema- none, stasis changes- none, varices- none           Lung- clear to P&A, wheeze- none, cough- none , dullness-none, rub- none           Chest wall-  Abd-  Br/ Gen/ Rectal- Not done, not indicated Extrem- cyanosis- none, clubbing, none, atrophy- none, strength- nl Neuro-  grossly intact to observation  Assessment & Plan:

## 2017-05-14 NOTE — Patient Instructions (Signed)
Methylphenidate refill script printed   We will record that your new PCP is Bayard Beaver with [facility]  We suggested you might use melatonin in the evening to help your brain identify night time.  Please call if you can help

## 2017-05-15 NOTE — Assessment & Plan Note (Addendum)
She is doing well since she can take naps when needed and adjust dose of Ritalin.  No daytime changes needed. She is not currently describing cataplexy.

## 2017-05-15 NOTE — Assessment & Plan Note (Signed)
This is a common aspect of abnormal sleep/wake control in narcolepsy syndrome.  We discussed sleep hygiene and I suggested she try melatonin.  She does have a prescription for a sedative hypnotic but has chosen not to use it.

## 2017-05-21 DIAGNOSIS — N938 Other specified abnormal uterine and vaginal bleeding: Secondary | ICD-10-CM | POA: Diagnosis not present

## 2017-05-21 DIAGNOSIS — E039 Hypothyroidism, unspecified: Secondary | ICD-10-CM | POA: Diagnosis not present

## 2017-05-21 DIAGNOSIS — I10 Essential (primary) hypertension: Secondary | ICD-10-CM | POA: Diagnosis not present

## 2017-07-03 DIAGNOSIS — N950 Postmenopausal bleeding: Secondary | ICD-10-CM | POA: Diagnosis not present

## 2017-07-03 DIAGNOSIS — N938 Other specified abnormal uterine and vaginal bleeding: Secondary | ICD-10-CM | POA: Diagnosis not present

## 2017-07-03 DIAGNOSIS — Z01419 Encounter for gynecological examination (general) (routine) without abnormal findings: Secondary | ICD-10-CM | POA: Diagnosis not present

## 2017-07-28 DIAGNOSIS — Z3202 Encounter for pregnancy test, result negative: Secondary | ICD-10-CM | POA: Diagnosis not present

## 2017-07-28 DIAGNOSIS — N950 Postmenopausal bleeding: Secondary | ICD-10-CM | POA: Diagnosis not present

## 2017-07-28 DIAGNOSIS — N9089 Other specified noninflammatory disorders of vulva and perineum: Secondary | ICD-10-CM | POA: Diagnosis not present

## 2017-07-29 DIAGNOSIS — L821 Other seborrheic keratosis: Secondary | ICD-10-CM | POA: Diagnosis not present

## 2017-08-13 DIAGNOSIS — N950 Postmenopausal bleeding: Secondary | ICD-10-CM | POA: Diagnosis not present

## 2017-08-13 DIAGNOSIS — N840 Polyp of corpus uteri: Secondary | ICD-10-CM | POA: Diagnosis not present

## 2017-08-13 DIAGNOSIS — N843 Polyp of vulva: Secondary | ICD-10-CM | POA: Diagnosis not present

## 2017-08-13 DIAGNOSIS — N9411 Superficial (introital) dyspareunia: Secondary | ICD-10-CM | POA: Diagnosis not present

## 2017-08-13 DIAGNOSIS — N9089 Other specified noninflammatory disorders of vulva and perineum: Secondary | ICD-10-CM | POA: Diagnosis not present

## 2017-08-13 DIAGNOSIS — N842 Polyp of vagina: Secondary | ICD-10-CM | POA: Diagnosis not present

## 2017-08-21 DIAGNOSIS — Z808 Family history of malignant neoplasm of other organs or systems: Secondary | ICD-10-CM | POA: Diagnosis not present

## 2017-08-21 DIAGNOSIS — D2239 Melanocytic nevi of other parts of face: Secondary | ICD-10-CM | POA: Diagnosis not present

## 2017-08-21 DIAGNOSIS — D225 Melanocytic nevi of trunk: Secondary | ICD-10-CM | POA: Diagnosis not present

## 2017-08-21 DIAGNOSIS — L565 Disseminated superficial actinic porokeratosis (DSAP): Secondary | ICD-10-CM | POA: Diagnosis not present

## 2017-08-21 DIAGNOSIS — D1800 Hemangioma unspecified site: Secondary | ICD-10-CM | POA: Diagnosis not present

## 2017-08-21 DIAGNOSIS — D2262 Melanocytic nevi of left upper limb, including shoulder: Secondary | ICD-10-CM | POA: Diagnosis not present

## 2017-08-21 DIAGNOSIS — Z86018 Personal history of other benign neoplasm: Secondary | ICD-10-CM | POA: Diagnosis not present

## 2017-08-21 DIAGNOSIS — Z23 Encounter for immunization: Secondary | ICD-10-CM | POA: Diagnosis not present

## 2017-09-10 DIAGNOSIS — N840 Polyp of corpus uteri: Secondary | ICD-10-CM | POA: Diagnosis not present

## 2017-09-10 DIAGNOSIS — N9089 Other specified noninflammatory disorders of vulva and perineum: Secondary | ICD-10-CM | POA: Diagnosis not present

## 2017-09-10 DIAGNOSIS — N939 Abnormal uterine and vaginal bleeding, unspecified: Secondary | ICD-10-CM | POA: Diagnosis not present

## 2017-09-25 IMAGING — MG 2D DIGITAL SCREENING BILATERAL MAMMOGRAM WITH CAD AND ADJUNCT TO
8 of 12 series · 8 of 28 positions shown · non-contrast
Comparison: Previous exam(s).

CLINICAL DATA: Screening.

EXAM:
2D DIGITAL SCREENING BILATERAL MAMMOGRAM WITH CAD AND ADJUNCT TOMO

[L MLO synth-2D]
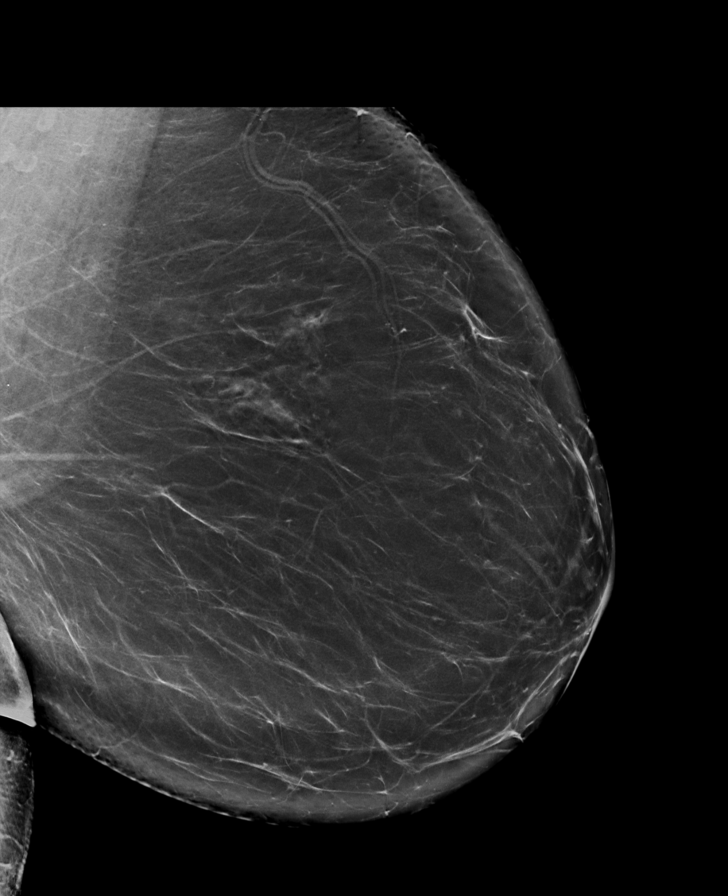

[R MLO synth-2D]
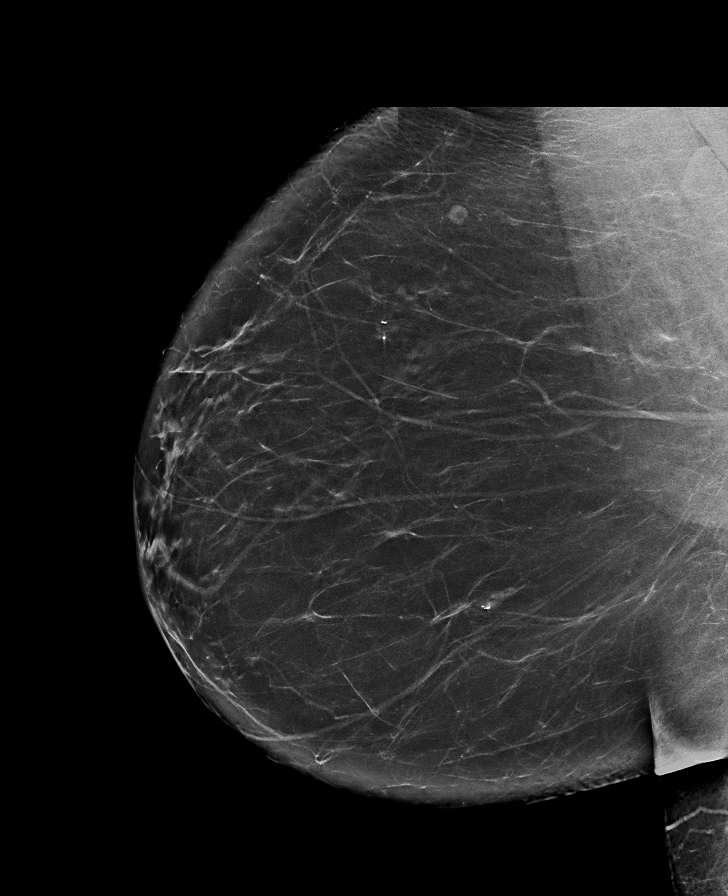

[R MLO]
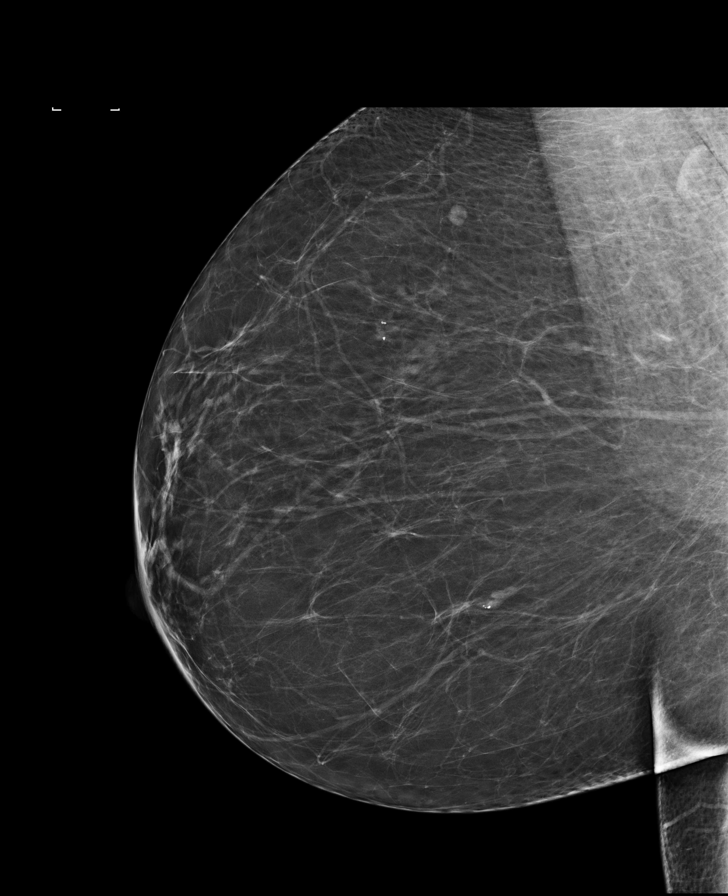

[R CC synth-2D]
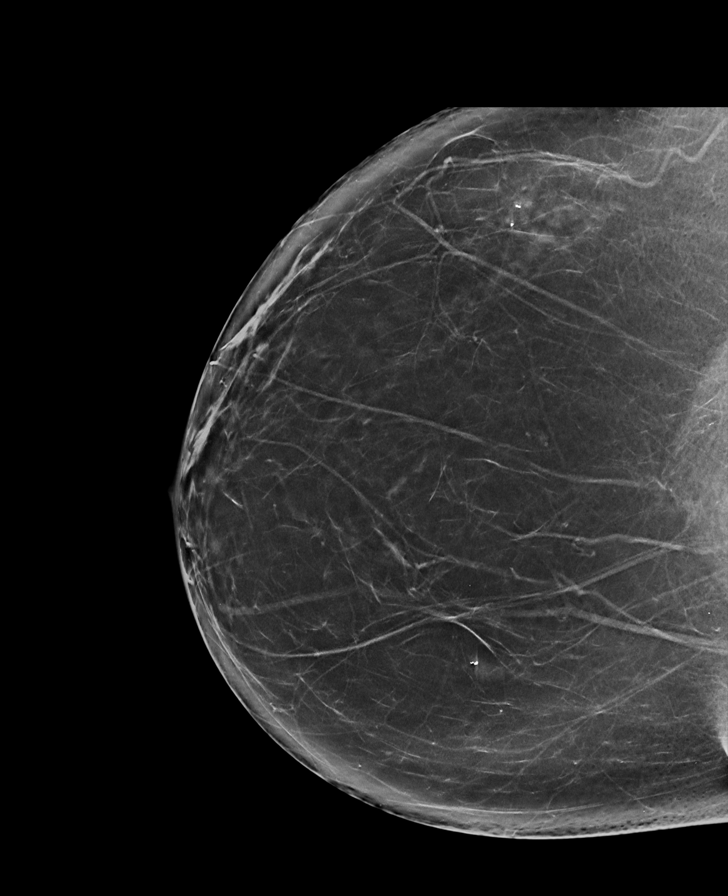

[L CC synth-2D]
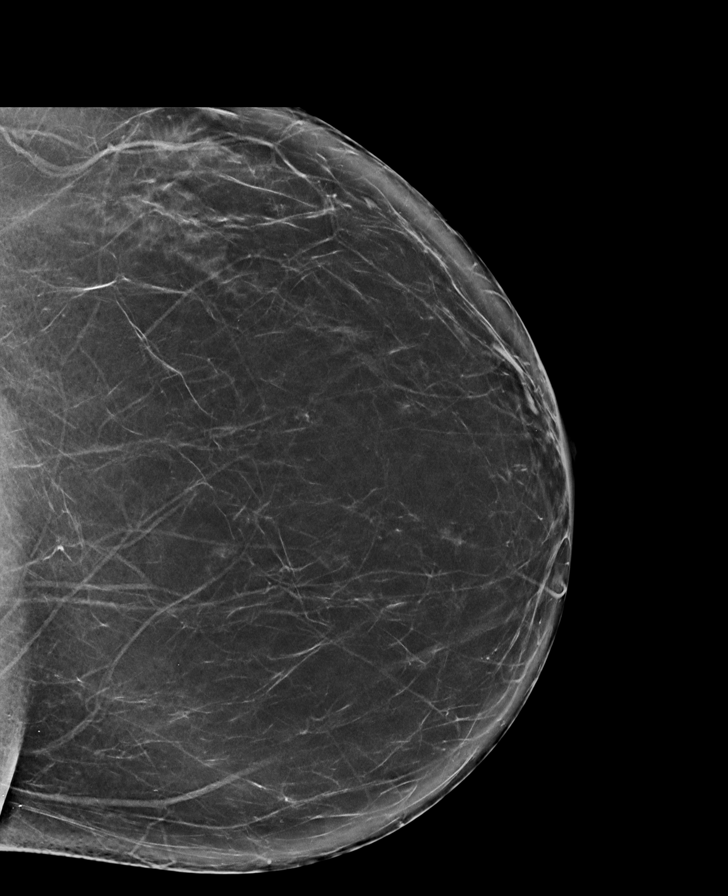

[L CC]
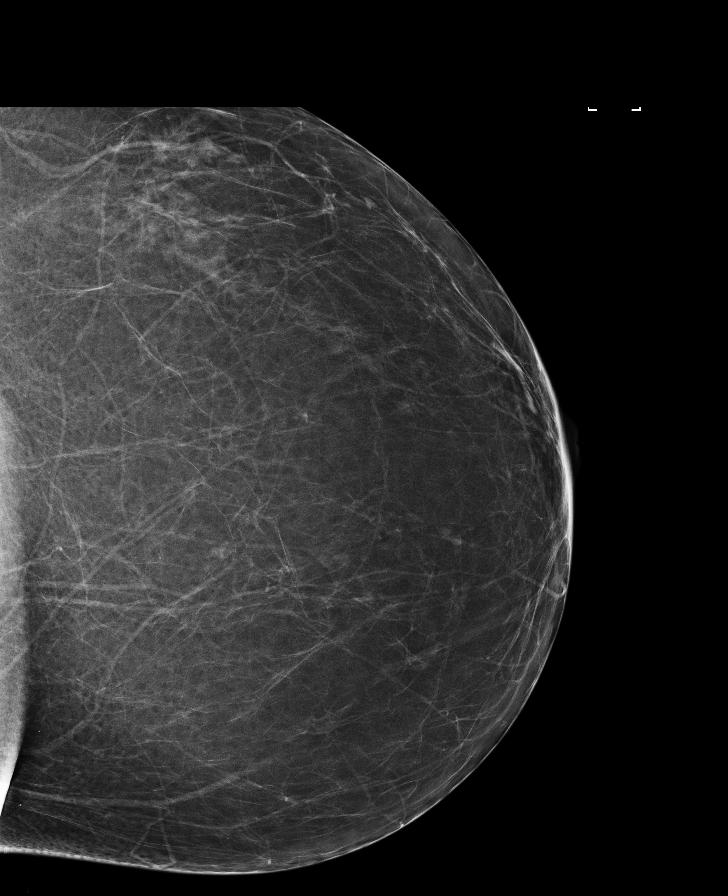

[L MLO]
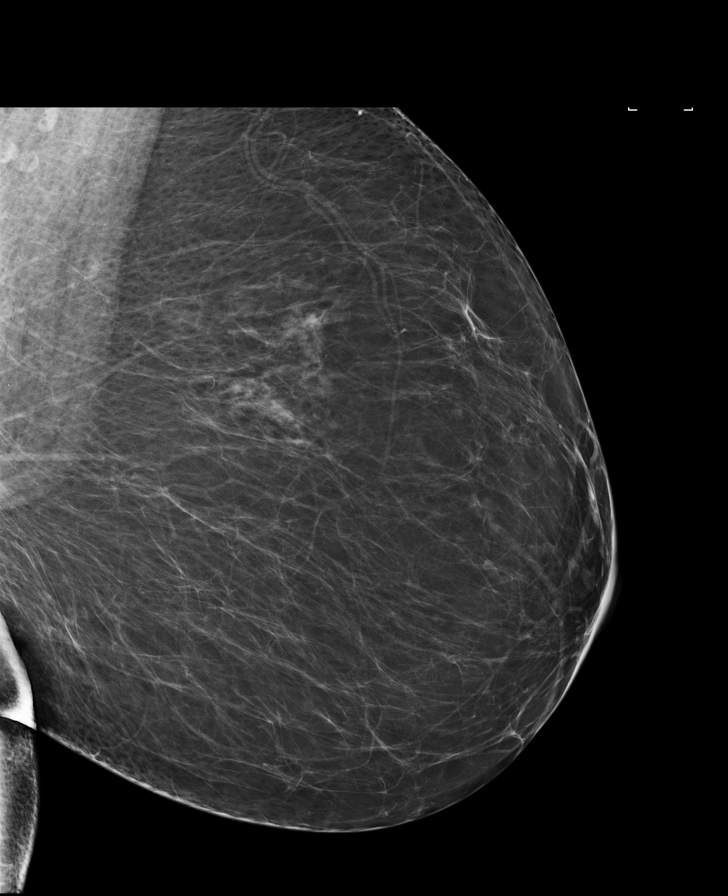

[R CC]
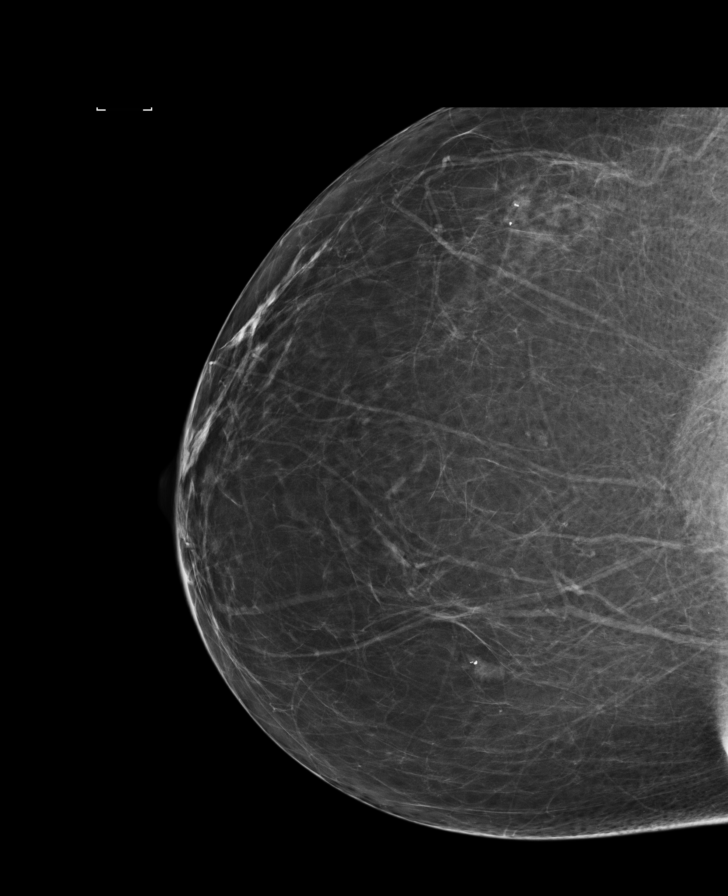

[8 of 28 positions shown; findings below may reference images not displayed]

ACR Breast Density Category b: There are scattered areas of
fibroglandular density.
FINDINGS: There are no findings suspicious for malignancy. Images were
processed with CAD.
IMPRESSION: No mammographic evidence of malignancy. A result letter of this
screening mammogram will be mailed directly to the patient.

RECOMMENDATION:
Screening mammogram in one year. (Code:97-6-RS4)

BI-RADS CATEGORY  1: Negative.

## 2017-10-07 DIAGNOSIS — L258 Unspecified contact dermatitis due to other agents: Secondary | ICD-10-CM | POA: Diagnosis not present

## 2017-10-12 DIAGNOSIS — H938X1 Other specified disorders of right ear: Secondary | ICD-10-CM | POA: Diagnosis not present

## 2017-10-12 DIAGNOSIS — L237 Allergic contact dermatitis due to plants, except food: Secondary | ICD-10-CM | POA: Diagnosis not present

## 2018-02-09 ENCOUNTER — Other Ambulatory Visit: Payer: Self-pay | Admitting: Physician Assistant

## 2018-02-09 ENCOUNTER — Other Ambulatory Visit: Payer: Self-pay | Admitting: Internal Medicine

## 2018-02-09 DIAGNOSIS — Z1231 Encounter for screening mammogram for malignant neoplasm of breast: Secondary | ICD-10-CM

## 2018-02-15 ENCOUNTER — Ambulatory Visit: Payer: PPO

## 2018-02-16 DIAGNOSIS — Z13820 Encounter for screening for osteoporosis: Secondary | ICD-10-CM | POA: Diagnosis not present

## 2018-02-16 DIAGNOSIS — Z0000 Encounter for general adult medical examination without abnormal findings: Secondary | ICD-10-CM | POA: Diagnosis not present

## 2018-02-16 DIAGNOSIS — Z23 Encounter for immunization: Secondary | ICD-10-CM | POA: Diagnosis not present

## 2018-02-16 DIAGNOSIS — Z1159 Encounter for screening for other viral diseases: Secondary | ICD-10-CM | POA: Diagnosis not present

## 2018-02-17 DIAGNOSIS — Z13820 Encounter for screening for osteoporosis: Secondary | ICD-10-CM | POA: Diagnosis not present

## 2018-02-17 DIAGNOSIS — Z23 Encounter for immunization: Secondary | ICD-10-CM | POA: Diagnosis not present

## 2018-02-17 DIAGNOSIS — Z0000 Encounter for general adult medical examination without abnormal findings: Secondary | ICD-10-CM | POA: Diagnosis not present

## 2018-02-17 DIAGNOSIS — Z1159 Encounter for screening for other viral diseases: Secondary | ICD-10-CM | POA: Diagnosis not present

## 2018-02-25 DIAGNOSIS — M8588 Other specified disorders of bone density and structure, other site: Secondary | ICD-10-CM | POA: Diagnosis not present

## 2018-02-25 DIAGNOSIS — Z13820 Encounter for screening for osteoporosis: Secondary | ICD-10-CM | POA: Diagnosis not present

## 2018-02-25 DIAGNOSIS — M85852 Other specified disorders of bone density and structure, left thigh: Secondary | ICD-10-CM | POA: Diagnosis not present

## 2018-02-25 DIAGNOSIS — Z780 Asymptomatic menopausal state: Secondary | ICD-10-CM | POA: Diagnosis not present

## 2018-03-09 DIAGNOSIS — Z1159 Encounter for screening for other viral diseases: Secondary | ICD-10-CM | POA: Diagnosis not present

## 2018-03-11 ENCOUNTER — Ambulatory Visit: Payer: PPO

## 2018-03-23 DIAGNOSIS — Z1231 Encounter for screening mammogram for malignant neoplasm of breast: Secondary | ICD-10-CM | POA: Diagnosis not present

## 2018-04-06 DIAGNOSIS — Z23 Encounter for immunization: Secondary | ICD-10-CM | POA: Diagnosis not present

## 2018-04-06 DIAGNOSIS — E039 Hypothyroidism, unspecified: Secondary | ICD-10-CM | POA: Diagnosis not present

## 2018-04-06 DIAGNOSIS — I10 Essential (primary) hypertension: Secondary | ICD-10-CM | POA: Diagnosis not present

## 2018-05-14 ENCOUNTER — Encounter: Payer: Self-pay | Admitting: Internal Medicine

## 2018-05-14 ENCOUNTER — Ambulatory Visit: Payer: PPO | Admitting: Internal Medicine

## 2018-05-14 VITALS — BP 130/82 | HR 99 | Ht 66.0 in | Wt 209.0 lb

## 2018-05-14 DIAGNOSIS — J4520 Mild intermittent asthma, uncomplicated: Secondary | ICD-10-CM

## 2018-05-14 DIAGNOSIS — G47411 Narcolepsy with cataplexy: Secondary | ICD-10-CM

## 2018-05-14 MED ORDER — METHYLPHENIDATE HCL ER (LA) 20 MG PO CP24: ORAL_CAPSULE | ORAL | 0 refills | Status: DC

## 2018-05-14 NOTE — Progress Notes (Signed)
Subjective:    Patient ID: Pamela Hernandez, female    DOB: 11-23-1950, 67 y.o.   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 We are looking for old records.  Early pattern included frequent cataplexy.  Nuvigil caused depression.  ----------------------------------------------------------------------------------- 05/14/17- 67 year old female never smoker followed for narcolepsy/cataplexy, insomnia, complicated by hypothyroidism, GERD asthma for tired and naps "whenever I can".  Sleeps poorly at night.  Has a sleep medication she has never chosen to try.  Uses Ritalin 20 mg LA, 1 or 2 daily, rarely 3.  Says she is not snoring much.  No cataplexy symptoms.  05/14/2018- 67 year old female never smoker followed for narcolepsy/cataplexy, insomnia, complicated by hypothyroidism, GERD, asthma -----States she does not take the ritalin often, takes naps often. Takes ritalin whenever she needs to drive.  Denies palpitations, mood changes or other concerns potentially associated with stimulant medication.  Retired now some more flexibility. Asthma managed by her PCP.  Some wheeze occasionally with exertion.  Review of Systems-see HPI + = positive Constitutional:   No-   weight loss, night sweats, fevers, chills, +fatigue, lassitude. HEENT:   No-  headaches, difficulty swallowing, tooth/dental problems, sore throat,       No-  sneezing, itching, ear ache, nasal congestion, post nasal drip,  CV:  No-   chest pain, orthopnea, PND, swelling in lower extremities, anasarca, dizziness, palpitations Resp: No-   shortness of breath with exertion or at rest.              No-   productive cough,  No non-productive cough,  No-  coughing up of blood.              No-   change in color of mucus.  + wheezing.   Skin: No-  rash or lesions except as per history of  present illness. GI:  No-   heartburn, indigestion, abdominal pain, nausea, vomiting,  GU: MS:  No-   joint pain or swelling.  Neuro- nothing unusual outside of history of present illness Psych:  No- change in mood or affect. + depression or anxiety.  No memory loss. Objective:   Physical Exam General- Alert, Oriented, Affect- nl, Distress- none physical; + overweight Skin- rash-over bridge of nose , lesions- none, excoriation- none Lymphadenopathy- none Head- atraumatic            Eyes- Gross vision intact, PERRLA, conjunctivae clear secretions            Ears- Hearing, canals- normal            Nose- Sniffing, No- Septal dev, mucus, polyps, erosion, perforation             Throat- Mallampati II-III , mucosa clear , drainage- none, tonsils- atrophic Neck- flexible , trachea midline, no stridor , thyroid nl, carotid no bruit Chest - symmetrical excursion , unlabored           Heart/CV- RRR , no murmur , no gallop  , no rub, nl s1 s2                           - JVD- none , edema- none, stasis changes- none, varices- none           Lung- clear to P&A, wheeze- none, cough- none , dullness-none, rub- none  Chest wall-  Abd-  Br/ Gen/ Rectal- Not done, not indicated Extrem- cyanosis- none, clubbing, none, atrophy- none, strength- nl Neuro- grossly intact to observation  Assessment & Plan:

## 2018-05-14 NOTE — Patient Instructions (Signed)
Script printed refilling Ritalin  You are doing well with naps as needed  Please call if we can help

## 2018-07-11 DIAGNOSIS — J4520 Mild intermittent asthma, uncomplicated: Secondary | ICD-10-CM | POA: Insufficient documentation

## 2018-07-11 NOTE — Assessment & Plan Note (Signed)
Complexity is rare now.  She takes 30-minute naps sometimes when needed and really only uses Ritalin for driving. Plan-refill Ritalin

## 2018-07-11 NOTE — Assessment & Plan Note (Signed)
Mild exertional asthma  managed by PCP with no concerns.

## 2018-09-07 DIAGNOSIS — L739 Follicular disorder, unspecified: Secondary | ICD-10-CM | POA: Diagnosis not present

## 2018-09-07 DIAGNOSIS — I10 Essential (primary) hypertension: Secondary | ICD-10-CM | POA: Diagnosis not present

## 2018-09-07 DIAGNOSIS — E039 Hypothyroidism, unspecified: Secondary | ICD-10-CM | POA: Diagnosis not present

## 2018-09-13 DIAGNOSIS — I10 Essential (primary) hypertension: Secondary | ICD-10-CM | POA: Diagnosis not present

## 2018-09-13 DIAGNOSIS — E039 Hypothyroidism, unspecified: Secondary | ICD-10-CM | POA: Diagnosis not present

## 2018-09-17 DIAGNOSIS — I10 Essential (primary) hypertension: Secondary | ICD-10-CM | POA: Diagnosis not present

## 2018-12-07 DIAGNOSIS — L720 Epidermal cyst: Secondary | ICD-10-CM | POA: Diagnosis not present

## 2018-12-07 DIAGNOSIS — D2262 Melanocytic nevi of left upper limb, including shoulder: Secondary | ICD-10-CM | POA: Diagnosis not present

## 2018-12-07 DIAGNOSIS — L570 Actinic keratosis: Secondary | ICD-10-CM | POA: Diagnosis not present

## 2018-12-07 DIAGNOSIS — D485 Neoplasm of uncertain behavior of skin: Secondary | ICD-10-CM | POA: Diagnosis not present

## 2018-12-07 DIAGNOSIS — L810 Postinflammatory hyperpigmentation: Secondary | ICD-10-CM | POA: Diagnosis not present

## 2018-12-07 DIAGNOSIS — Z86018 Personal history of other benign neoplasm: Secondary | ICD-10-CM | POA: Diagnosis not present

## 2018-12-07 DIAGNOSIS — L814 Other melanin hyperpigmentation: Secondary | ICD-10-CM | POA: Diagnosis not present

## 2018-12-07 DIAGNOSIS — Z808 Family history of malignant neoplasm of other organs or systems: Secondary | ICD-10-CM | POA: Diagnosis not present

## 2018-12-07 DIAGNOSIS — D225 Melanocytic nevi of trunk: Secondary | ICD-10-CM | POA: Diagnosis not present

## 2019-03-16 DIAGNOSIS — K219 Gastro-esophageal reflux disease without esophagitis: Secondary | ICD-10-CM | POA: Diagnosis not present

## 2019-03-16 DIAGNOSIS — E039 Hypothyroidism, unspecified: Secondary | ICD-10-CM | POA: Diagnosis not present

## 2019-03-16 DIAGNOSIS — I10 Essential (primary) hypertension: Secondary | ICD-10-CM | POA: Diagnosis not present

## 2019-03-17 DIAGNOSIS — H538 Other visual disturbances: Secondary | ICD-10-CM | POA: Diagnosis not present

## 2019-03-17 DIAGNOSIS — Z0000 Encounter for general adult medical examination without abnormal findings: Secondary | ICD-10-CM | POA: Diagnosis not present

## 2019-03-17 DIAGNOSIS — Z23 Encounter for immunization: Secondary | ICD-10-CM | POA: Diagnosis not present

## 2019-03-17 DIAGNOSIS — E039 Hypothyroidism, unspecified: Secondary | ICD-10-CM | POA: Diagnosis not present

## 2019-03-29 DIAGNOSIS — Z1231 Encounter for screening mammogram for malignant neoplasm of breast: Secondary | ICD-10-CM | POA: Diagnosis not present

## 2019-03-29 DIAGNOSIS — Z1239 Encounter for other screening for malignant neoplasm of breast: Secondary | ICD-10-CM | POA: Diagnosis not present

## 2019-05-16 ENCOUNTER — Encounter: Payer: Self-pay | Admitting: Internal Medicine

## 2019-05-16 ENCOUNTER — Ambulatory Visit: Payer: PPO | Admitting: Internal Medicine

## 2019-05-16 ENCOUNTER — Other Ambulatory Visit: Payer: Self-pay

## 2019-05-16 DIAGNOSIS — G4709 Other insomnia: Secondary | ICD-10-CM | POA: Diagnosis not present

## 2019-05-16 DIAGNOSIS — G47411 Narcolepsy with cataplexy: Secondary | ICD-10-CM | POA: Diagnosis not present

## 2019-05-16 MED ORDER — METHYLPHENIDATE HCL ER (LA) 20 MG PO CP24: ORAL_CAPSULE | ORAL | 0 refills | Status: DC

## 2019-05-16 NOTE — Progress Notes (Signed)
Subjective:    Patient ID: Pamela Hernandez, female    DOB: Dec 10, 1950, 68 y.o.   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 We are looking for old records.  Early pattern included frequent cataplexy.  Nuvigil caused depression.  -----------------------------------------------------------------------------------  05/14/2018- 68 year old female never smoker followed for narcolepsy/cataplexy, insomnia, complicated by hypothyroidism, GERD, asthma -----States she does not take the ritalin often, takes naps often. Takes ritalin whenever she needs to drive.  Denies palpitations, mood changes or other concerns potentially associated with stimulant medication.  Retired now some more flexibility. Asthma managed by her PCP.  Some wheeze occasionally with exertion.  05/16/2019- 68 year old female never smoker followed for Narcolepsy/cataplexy, insomnia, complicated by hypothyroidism, GERD, asthma Ritalin LA 20,  -----1 f/u for Narcolepsy UTD flu and Prevnar 13 Prefers to nap, but uses Ritalin LA occasionally, especially for driving.  HS about 11PM, wakes about 1AM and is up for an hour, then back to sleep.  This schedule is ok with her since she is retired.  Review of Systems-see HPI + = positive Constitutional:   No-   weight loss, night sweats, fevers, chills, +fatigue, lassitude. HEENT:   No-  headaches, difficulty swallowing, tooth/dental problems, sore throat,       No-  sneezing, itching, ear ache, nasal congestion, post nasal drip,  CV:  No-   chest pain, orthopnea, PND, swelling in lower extremities, anasarca, dizziness, palpitations Resp: No-   shortness of breath with exertion or at rest.              No-   productive cough,  No non-productive cough,  No-  coughing up of blood.              No-   change in color of mucus.  +  wheezing.   Skin: No-  rash or lesions except as per history of present illness. GI:  No-   heartburn, indigestion, abdominal pain, nausea, vomiting,  GU: MS:  No-   joint pain or swelling.  Neuro- nothing unusual outside of history of present illness Psych:  No- change in mood or affect. + depression or anxiety.  No memory loss. Objective:   Physical Exam General- Alert, Oriented, Affect- nl, Distress- none physical; + overweight Skin- rash-over bridge of nose , lesions- none, excoriation- none Lymphadenopathy- none Head- atraumatic            Eyes- Gross vision intact, PERRLA, conjunctivae clear secretions            Ears- Hearing, canals- normal            Nose- Sniffing, No- Septal dev, mucus, polyps, erosion, perforation             Throat- Mallampati II-III , mucosa clear , drainage- none, tonsils- atrophic Neck- flexible , trachea midline, no stridor , thyroid nl, carotid no bruit Chest - symmetrical excursion , unlabored           Heart/CV- RRR , no murmur , no gallop  , no rub, nl s1 s2                           - JVD- none , edema- none, stasis changes- none, varices- none           Lung- clear to P&A, wheeze- none, cough- none ,  dullness-none, rub- none           Chest wall-  Abd-  Br/ Gen/ Rectal- Not done, not indicated Extrem- cyanosis- none, clubbing, none, atrophy- none, strength- nl Neuro- grossly intact to observation  Assessment & Plan:

## 2019-05-16 NOTE — Patient Instructions (Signed)
Ritalin refill e-sent  Please call if we can help

## 2019-05-29 NOTE — Assessment & Plan Note (Signed)
Not seeking a sleep aid. She is ok with current pattern.

## 2019-05-29 NOTE — Assessment & Plan Note (Signed)
Stable pattern. Conservative use of Ritalin and appropriate naps. No changes needed.

## 2019-12-01 DIAGNOSIS — E559 Vitamin D deficiency, unspecified: Secondary | ICD-10-CM | POA: Diagnosis not present

## 2019-12-01 DIAGNOSIS — E039 Hypothyroidism, unspecified: Secondary | ICD-10-CM | POA: Diagnosis not present

## 2019-12-01 DIAGNOSIS — J4521 Mild intermittent asthma with (acute) exacerbation: Secondary | ICD-10-CM | POA: Diagnosis not present

## 2019-12-01 DIAGNOSIS — G629 Polyneuropathy, unspecified: Secondary | ICD-10-CM | POA: Diagnosis not present

## 2019-12-01 DIAGNOSIS — R5383 Other fatigue: Secondary | ICD-10-CM | POA: Diagnosis not present

## 2020-02-02 DIAGNOSIS — D1801 Hemangioma of skin and subcutaneous tissue: Secondary | ICD-10-CM | POA: Diagnosis not present

## 2020-02-02 DIAGNOSIS — D2262 Melanocytic nevi of left upper limb, including shoulder: Secondary | ICD-10-CM | POA: Diagnosis not present

## 2020-02-02 DIAGNOSIS — Q828 Other specified congenital malformations of skin: Secondary | ICD-10-CM | POA: Diagnosis not present

## 2020-02-02 DIAGNOSIS — Z808 Family history of malignant neoplasm of other organs or systems: Secondary | ICD-10-CM | POA: Diagnosis not present

## 2020-02-02 DIAGNOSIS — D225 Melanocytic nevi of trunk: Secondary | ICD-10-CM | POA: Diagnosis not present

## 2020-02-02 DIAGNOSIS — L570 Actinic keratosis: Secondary | ICD-10-CM | POA: Diagnosis not present

## 2020-02-02 DIAGNOSIS — Z86018 Personal history of other benign neoplasm: Secondary | ICD-10-CM | POA: Diagnosis not present

## 2020-04-30 DIAGNOSIS — Z0000 Encounter for general adult medical examination without abnormal findings: Secondary | ICD-10-CM | POA: Diagnosis not present

## 2020-04-30 DIAGNOSIS — Z79899 Other long term (current) drug therapy: Secondary | ICD-10-CM | POA: Diagnosis not present

## 2020-04-30 DIAGNOSIS — K219 Gastro-esophageal reflux disease without esophagitis: Secondary | ICD-10-CM | POA: Diagnosis not present

## 2020-04-30 DIAGNOSIS — M8580 Other specified disorders of bone density and structure, unspecified site: Secondary | ICD-10-CM | POA: Diagnosis not present

## 2020-04-30 DIAGNOSIS — H538 Other visual disturbances: Secondary | ICD-10-CM | POA: Diagnosis not present

## 2020-04-30 DIAGNOSIS — E559 Vitamin D deficiency, unspecified: Secondary | ICD-10-CM | POA: Diagnosis not present

## 2020-04-30 DIAGNOSIS — E039 Hypothyroidism, unspecified: Secondary | ICD-10-CM | POA: Diagnosis not present

## 2020-04-30 DIAGNOSIS — I10 Essential (primary) hypertension: Secondary | ICD-10-CM | POA: Diagnosis not present

## 2020-05-15 ENCOUNTER — Ambulatory Visit: Payer: PPO | Admitting: Internal Medicine

## 2020-05-15 ENCOUNTER — Encounter: Payer: Self-pay | Admitting: Internal Medicine

## 2020-05-15 ENCOUNTER — Other Ambulatory Visit: Payer: Self-pay

## 2020-05-15 DIAGNOSIS — J4520 Mild intermittent asthma, uncomplicated: Secondary | ICD-10-CM | POA: Diagnosis not present

## 2020-05-15 DIAGNOSIS — G47411 Narcolepsy with cataplexy: Secondary | ICD-10-CM

## 2020-05-15 MED ORDER — METHYLPHENIDATE HCL ER (LA) 20 MG PO CP24: ORAL_CAPSULE | ORAL | 0 refills | Status: AC

## 2020-05-15 NOTE — Progress Notes (Signed)
Subjective:    Patient ID: Pamela Hernandez, female    DOB: 19-May-1951, 69 y.o.   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 We are looking for old records.  Early pattern included frequent cataplexy.  Nuvigil caused depression.  -----------------------------------------------------------------------------------  05/16/2019- 69 year old female never smoker followed for Narcolepsy/cataplexy, insomnia, complicated by hypothyroidism, GERD, asthma Ritalin LA 20,  -----1 f/u for Narcolepsy UTD flu and Prevnar 13 Prefers to nap, but uses Ritalin LA occasionally, especially for driving.  HS about 11PM, wakes about 1AM and is up for an hour, then back to sleep.  This schedule is ok with her since she is retired.  05/15/20- 69 year old female never smoker followed for Narcolepsy/cataplexy, insomnia, complicated by hypothyroidism, GERD, asthma Ritalin LA 20  1-2 daily    Covid vax- 3 Moderna Flu vax- had Not using her ritalin now- no problem. Meds work ok but shee is staying at home, naps when she wants, and chooses not to to. Also not using Ventolin. Has a new cat which helps her mood.   Review of Systems-see HPI + = positive Constitutional:   No-   weight loss, night sweats, fevers, chills, +fatigue, lassitude. HEENT:   No-  headaches, difficulty swallowing, tooth/dental problems, sore throat,       No-  sneezing, itching, ear ache, nasal congestion, post nasal drip,  CV:  No-   chest pain, orthopnea, PND, swelling in lower extremities, anasarca, dizziness, palpitations Resp: No-   shortness of breath with exertion or at rest.              No-   productive cough,  No non-productive cough,  No-  coughing up of blood.              No-   change in color of mucus.  + wheezing.   Skin: No-  rash or lesions except as per history of present  illness. GI:  No-   heartburn, indigestion, abdominal pain, nausea, vomiting,  GU: MS:  No-   joint pain or swelling.  Neuro- nothing unusual outside of history of present illness Psych:  No- change in mood or affect. + depression or anxiety.  No memory loss. Objective:    Physical Exam General- Alert, Oriented, Affect- nl, Distress- none physical; + overweight Lymphadenopathy- none Skin- rash- none, lesions- none, excoriation- none Head- atraumatic            Eyes- Gross vision intact, PERRLA, conjunctivae clear secretions            Ears- Hearing, canals- normal            Nose- Sniffing, No- Septal dev, mucus, polyps, erosion, perforation             Throat- Mallampati II-III , mucosa clear , drainage- none, tonsils- atrophic Neck- flexible , trachea midline, no stridor , thyroid nl, carotid no bruit Chest - symmetrical excursion , unlabored           Heart/CV- RRR , no murmur , no gallop  , no rub, nl s1 s2                           - JVD- none , edema- none, stasis changes- none, varices- none           Lung- clear to P&A, wheeze-  none, cough- none , dullness-none, rub- none           Chest wall-  Abd-  Br/ Gen/ Rectal- Not done, not indicated Extrem- cyanosis- none, clubbing, none, atrophy- none, strength- nl Neuro- grossly intact to observation  Assessment & Plan:

## 2020-05-15 NOTE — Patient Instructions (Signed)
Ritalin refill was sent. Try using it again- see if it helps with the tired feeling. Be especially carefull about alert driving.   Please call if we can help

## 2020-06-02 NOTE — Assessment & Plan Note (Signed)
Says she is not needing her rescue inhaler and no exacerbation, despite having new cat. Plan- help if needed

## 2020-06-02 NOTE — Assessment & Plan Note (Signed)
She is trying to manage just with naps. Ok as long as shee is satisfied. Mostly staying home. Plan- can resume ritalin if needed. Safe driving.

## 2020-06-22 DIAGNOSIS — M8580 Other specified disorders of bone density and structure, unspecified site: Secondary | ICD-10-CM | POA: Diagnosis not present

## 2020-06-22 DIAGNOSIS — Z1231 Encounter for screening mammogram for malignant neoplasm of breast: Secondary | ICD-10-CM | POA: Diagnosis not present

## 2020-06-22 DIAGNOSIS — M810 Age-related osteoporosis without current pathological fracture: Secondary | ICD-10-CM | POA: Diagnosis not present

## 2020-06-22 DIAGNOSIS — Z780 Asymptomatic menopausal state: Secondary | ICD-10-CM | POA: Diagnosis not present

## 2020-07-24 DIAGNOSIS — H2513 Age-related nuclear cataract, bilateral: Secondary | ICD-10-CM | POA: Diagnosis not present

## 2020-07-24 DIAGNOSIS — H524 Presbyopia: Secondary | ICD-10-CM | POA: Diagnosis not present

## 2020-07-24 DIAGNOSIS — H5203 Hypermetropia, bilateral: Secondary | ICD-10-CM | POA: Diagnosis not present

## 2020-07-24 DIAGNOSIS — H52203 Unspecified astigmatism, bilateral: Secondary | ICD-10-CM | POA: Diagnosis not present

## 2020-07-24 DIAGNOSIS — H02831 Dermatochalasis of right upper eyelid: Secondary | ICD-10-CM | POA: Diagnosis not present

## 2020-07-24 DIAGNOSIS — H02834 Dermatochalasis of left upper eyelid: Secondary | ICD-10-CM | POA: Diagnosis not present

## 2020-07-24 DIAGNOSIS — H18523 Epithelial (juvenile) corneal dystrophy, bilateral: Secondary | ICD-10-CM | POA: Diagnosis not present

## 2020-07-24 DIAGNOSIS — H25043 Posterior subcapsular polar age-related cataract, bilateral: Secondary | ICD-10-CM | POA: Diagnosis not present

## 2020-07-24 DIAGNOSIS — H25011 Cortical age-related cataract, right eye: Secondary | ICD-10-CM | POA: Diagnosis not present

## 2020-10-22 DIAGNOSIS — H524 Presbyopia: Secondary | ICD-10-CM | POA: Diagnosis not present

## 2020-10-22 DIAGNOSIS — H5203 Hypermetropia, bilateral: Secondary | ICD-10-CM | POA: Diagnosis not present

## 2020-10-22 DIAGNOSIS — H25011 Cortical age-related cataract, right eye: Secondary | ICD-10-CM | POA: Diagnosis not present

## 2020-10-22 DIAGNOSIS — H18523 Epithelial (juvenile) corneal dystrophy, bilateral: Secondary | ICD-10-CM | POA: Diagnosis not present

## 2020-10-22 DIAGNOSIS — H2513 Age-related nuclear cataract, bilateral: Secondary | ICD-10-CM | POA: Diagnosis not present

## 2021-01-09 DIAGNOSIS — D1801 Hemangioma of skin and subcutaneous tissue: Secondary | ICD-10-CM | POA: Diagnosis not present

## 2021-01-09 DIAGNOSIS — L821 Other seborrheic keratosis: Secondary | ICD-10-CM | POA: Diagnosis not present

## 2021-01-09 DIAGNOSIS — D225 Melanocytic nevi of trunk: Secondary | ICD-10-CM | POA: Diagnosis not present

## 2021-01-09 DIAGNOSIS — L578 Other skin changes due to chronic exposure to nonionizing radiation: Secondary | ICD-10-CM | POA: Diagnosis not present

## 2021-01-09 DIAGNOSIS — L723 Sebaceous cyst: Secondary | ICD-10-CM | POA: Diagnosis not present

## 2021-01-09 DIAGNOSIS — D2262 Melanocytic nevi of left upper limb, including shoulder: Secondary | ICD-10-CM | POA: Diagnosis not present

## 2021-01-09 DIAGNOSIS — Q828 Other specified congenital malformations of skin: Secondary | ICD-10-CM | POA: Diagnosis not present

## 2021-02-05 DIAGNOSIS — H18523 Epithelial (juvenile) corneal dystrophy, bilateral: Secondary | ICD-10-CM | POA: Diagnosis not present

## 2021-02-05 DIAGNOSIS — H5203 Hypermetropia, bilateral: Secondary | ICD-10-CM | POA: Diagnosis not present

## 2021-02-05 DIAGNOSIS — H52223 Regular astigmatism, bilateral: Secondary | ICD-10-CM | POA: Diagnosis not present

## 2021-02-05 DIAGNOSIS — H2513 Age-related nuclear cataract, bilateral: Secondary | ICD-10-CM | POA: Diagnosis not present

## 2021-02-05 DIAGNOSIS — H524 Presbyopia: Secondary | ICD-10-CM | POA: Diagnosis not present

## 2021-02-05 DIAGNOSIS — H25043 Posterior subcapsular polar age-related cataract, bilateral: Secondary | ICD-10-CM | POA: Diagnosis not present

## 2021-02-05 DIAGNOSIS — H25011 Cortical age-related cataract, right eye: Secondary | ICD-10-CM | POA: Diagnosis not present

## 2021-03-12 DIAGNOSIS — E039 Hypothyroidism, unspecified: Secondary | ICD-10-CM | POA: Diagnosis not present

## 2021-03-12 DIAGNOSIS — M2042 Other hammer toe(s) (acquired), left foot: Secondary | ICD-10-CM | POA: Diagnosis not present

## 2021-03-12 DIAGNOSIS — M21619 Bunion of unspecified foot: Secondary | ICD-10-CM | POA: Diagnosis not present

## 2021-03-12 DIAGNOSIS — K219 Gastro-esophageal reflux disease without esophagitis: Secondary | ICD-10-CM | POA: Diagnosis not present

## 2021-03-12 DIAGNOSIS — I10 Essential (primary) hypertension: Secondary | ICD-10-CM | POA: Diagnosis not present

## 2021-04-04 DIAGNOSIS — R2681 Unsteadiness on feet: Secondary | ICD-10-CM | POA: Diagnosis not present

## 2021-04-04 DIAGNOSIS — M79672 Pain in left foot: Secondary | ICD-10-CM | POA: Diagnosis not present

## 2021-04-04 DIAGNOSIS — M79671 Pain in right foot: Secondary | ICD-10-CM | POA: Diagnosis not present

## 2021-04-04 DIAGNOSIS — M2011 Hallux valgus (acquired), right foot: Secondary | ICD-10-CM | POA: Diagnosis not present

## 2021-04-04 DIAGNOSIS — M2012 Hallux valgus (acquired), left foot: Secondary | ICD-10-CM | POA: Diagnosis not present

## 2021-05-13 DIAGNOSIS — E559 Vitamin D deficiency, unspecified: Secondary | ICD-10-CM | POA: Diagnosis not present

## 2021-06-11 ENCOUNTER — Ambulatory Visit: Payer: PPO | Admitting: Internal Medicine

## 2021-06-27 ENCOUNTER — Ambulatory Visit: Payer: PPO | Admitting: Internal Medicine

## 2024-02-19 ENCOUNTER — Ambulatory Visit (INDEPENDENT_AMBULATORY_CARE_PROVIDER_SITE_OTHER): Admitting: Pulmonary Disease

## 2024-02-19 VITALS — BP 138/75 | HR 81 | Temp 89.3°F | Ht 65.0 in | Wt 207.8 lb

## 2024-02-19 DIAGNOSIS — F19982 Other psychoactive substance use, unspecified with psychoactive substance-induced sleep disorder: Secondary | ICD-10-CM

## 2024-02-19 DIAGNOSIS — G47411 Narcolepsy with cataplexy: Secondary | ICD-10-CM | POA: Diagnosis not present

## 2024-02-19 DIAGNOSIS — J4540 Moderate persistent asthma, uncomplicated: Secondary | ICD-10-CM | POA: Diagnosis not present

## 2024-02-19 MED ORDER — FLUTICASONE-SALMETEROL 250-50 MCG/ACT IN AEPB: 1.0000 | INHALATION_SPRAY | Freq: Two times a day (BID) | RESPIRATORY_TRACT | 3 refills | Status: DC

## 2024-02-19 NOTE — Patient Instructions (Signed)
 Prescription for Wixela 250 placed Can be used once a day if your symptoms are controlled but it is a  twice a day medication  Continue use of albuterol as needed  Avoid known triggers  Schedule for pulmonary function test   Regarding history of narcolepsy -Continuing stimulant medications will need reevaluation of the diagnosis as the diagnosis was made many years ago -This usually includes having a sleep study done and an MSLT with you being off the stimulants for a safe period of time-study can be falsely negative if performed while someone takes stimulants regularly  Follow-up in 6 to 8 weeks

## 2024-02-19 NOTE — Progress Notes (Signed)
 Pamela Hernandez    [id number]    07/02/1951  Primary Care Physician:Rogers, Tully HERO, PA-C  Referring Physician: Sharl Tully HERO, PA-C [id number] [place],  [place] [address]  Chief complaint:   Patient being seen for concern for asthma She does have a past history of narcolepsy  HPI:  Patient with a history of asthma She does have shortness of breath, wheezing Has been using Wixela 100 Only using the inhaler as needed  Longstanding history of asthma, sensitive to dust, sensitive to pollen  Does have a past history of narcolepsy diagnosed in the 90s Study is not available She is on Ritalin  which she uses as needed Usually has to take a daily nap  Last saw Dr. 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   fluticasone -salmeterol (WIXELA INHUB) 100-50 MCG/ACT AEPB Inhale 1 puff into the lungs 2 (two) times daily.   levothyroxine  (SYNTHROID ) 137 MCG tablet Take 137 mcg by mouth.   levothyroxine  (SYNTHROID , LEVOTHROID) 88 MCG tablet Take 1 tablet by mouth daily.   methylphenidate  (RITALIN  LA) 20 MG 24 hr capsule 1-2 daily as needed   pantoprazole (PROTONIX) 40 MG tablet Take 1 tablet by mouth daily.   lisinopril (PRINIVIL,ZESTRIL) 5 MG tablet Take 1 tablet by mouth daily. (Patient not taking: Reported on 02/19/2024)   No facility-administered encounter medications on file as of 02/19/2024.    Allergies as of 02/19/2024 -  Review Complete 05/15/2020  Allergen Reaction Noted   Dust mite extract  02/19/2024   Shellfish allergy      Past Medical History:  Diagnosis Date   Asthma    Hypothyroid    Narcolepsy    Skin cancer     No past surgical history on file.  Family History  Problem Relation Age of Onset   Asthma Mother    Heart disease Father    Breast cancer Neg Hx     Social History   Socioeconomic History   Marital status: Married    Spouse name: Not on file   Number of children: Not on file   Years of education: Not on file   Highest education level: Not on file  Occupational History   Occupation: Firefighter  Tobacco Use   Smoking status: Never   Smokeless tobacco: Never  Substance and Sexual Activity   Alcohol use: Not on file   Drug use: Not on file   Sexual activity: Not on file  Other Topics Concern   Not on file  Social History Narrative   Not on file   Social Drivers of Health   Financial Resource Strain: Not on file  Food Insecurity: Low Risk  (10/13/2023)   Received from [facility]   Hunger Vital Sign    Within the past 12 months, you worried that your food would run out before you got money to buy more: Never true    Within the  past 12 months, the food you bought just didn't last and you didn't have money to get more. : Never true  Transportation Needs: No Transportation Needs (10/13/2023)   Received from [facility]    In the past 12 months, has lack of reliable transportation kept you from medical appointments, meetings, work or from getting things needed for daily living? : No  Physical Activity: Not on file  Stress: Not on file  Social Connections: Not on file  Intimate Partner Violence: Not on file    Review of Systems  Respiratory:  Positive for shortness of breath and wheezing.   Psychiatric/Behavioral:  Positive for sleep disturbance.     Vitals:   02/19/24 0924  BP: 138/75  Pulse: 81  Temp: (!) 89.3 F (31.8 C)  SpO2: 92%      Physical Exam Constitutional:      Appearance: She is obese. She is not ill-appearing.  HENT:     Head: Normocephalic.     Mouth/Throat:     Mouth: Mucous membranes are moist.  Eyes:     General: No scleral icterus. Cardiovascular:     Rate and Rhythm: Normal rate and regular rhythm.     Heart sounds: No murmur heard.    No friction rub.  Pulmonary:     Effort: No respiratory distress.     Breath sounds: No stridor. No wheezing or rhonchi.  Musculoskeletal:     Cervical back: No rigidity or tenderness.  Neurological:     Mental Status: She is alert.  Psychiatric:        Mood and Affect: Mood normal.       02/19/2024    9:00 AM  Results of the Epworth flowsheet  Sitting and reading 0  Watching TV 3  Sitting, inactive in a public place (e.g. a theatre or a meeting) 3  As a passenger in a car for an hour without a break 3  Lying down to rest in the afternoon when circumstances permit 3  Sitting and talking to someone 3  Sitting quietly after a lunch without alcohol 3  In a car, while stopped for a few minutes in traffic 3  Total score 21     Data Reviewed: Records from Dr. Neysa from 2021 reviewed  Most recent visit with primary care at [facility] reviewed  Assessment/Plan:  Lifelong asthma on Wixela 100 - I think she will benefit from having Wixela dose increased to 250 - May need to use it twice a day or stick with once a day as she was doing with Wixela 100 - Symptoms are not significantly exacerbated at present -Counseled about the need to avoid known triggers  Longstanding history of narcolepsy - Uses Ritalin  as needed - Nonrestorative sleep with a history of insomnia - Excessive daytime sleepiness  -Study was at least 2530 years ago and multiple factors may be contributing to her significant excessive daytime sleepiness  I think she will benefit from having a repeat sleep study performed with MSLT to ascertain diagnosis and focus on treating other  contributors to excessive daytime sleepiness which may be in adequate sleep relating to having insomnia as well  - She will think about this  Schedule for pulmonary function test  Follow-up in about 6 to 8 weeks  Jennet Epley MD [facility] 02/19/2024, 9:47 AM  CC: Sharl Tully HERO, PA-C

## 2024-04-27 ENCOUNTER — Other Ambulatory Visit: Payer: Self-pay

## 2024-04-27 DIAGNOSIS — J4540 Moderate persistent asthma, uncomplicated: Secondary | ICD-10-CM

## 2024-05-04 ENCOUNTER — Ambulatory Visit (INDEPENDENT_AMBULATORY_CARE_PROVIDER_SITE_OTHER)

## 2024-05-04 ENCOUNTER — Ambulatory Visit: Admitting: Pulmonary Disease

## 2024-05-04 ENCOUNTER — Encounter: Payer: Self-pay | Admitting: Pulmonary Disease

## 2024-05-04 VITALS — BP 106/66 | HR 95 | Temp 97.9°F | Ht 66.0 in | Wt 206.8 lb

## 2024-05-04 DIAGNOSIS — G47419 Narcolepsy without cataplexy: Secondary | ICD-10-CM

## 2024-05-04 DIAGNOSIS — J4540 Moderate persistent asthma, uncomplicated: Secondary | ICD-10-CM

## 2024-05-04 DIAGNOSIS — J449 Chronic obstructive pulmonary disease, unspecified: Secondary | ICD-10-CM | POA: Diagnosis not present

## 2024-05-04 LAB — PULMONARY FUNCTION TEST
DL/VA % pred: 98 %
DL/VA: 3.98 ml/min/mmHg/L
DLCO unc % pred: 90 %
DLCO unc: 18.72 ml/min/mmHg
FEF 25-75 Post: 2.19 L/s
FEF 25-75 Pre: 1.03 L/s
FEF2575-%Change-Post: 112 %
FEF2575-%Pred-Post: 113 %
FEF2575-%Pred-Pre: 53 %
FEV1-%Change-Post: 23 %
FEV1-%Pred-Post: 81 %
FEV1-%Pred-Pre: 66 %
FEV1-Post: 1.96 L
FEV1-Pre: 1.58 L
FEV1FVC-%Change-Post: -5 %
FEV1FVC-%Pred-Pre: 92 %
FEV6-%Change-Post: 26 %
FEV6-%Pred-Post: 93 %
FEV6-%Pred-Pre: 74 %
FEV6-Post: 2.85 L
FEV6-Pre: 2.24 L
FEV6FVC-%Change-Post: -2 %
FEV6FVC-%Pred-Post: 101 %
FEV6FVC-%Pred-Pre: 104 %
FVC-%Change-Post: 30 %
FVC-%Pred-Post: 92 %
FVC-%Pred-Pre: 71 %
FVC-Post: 2.95 L
FVC-Pre: 2.26 L
Post FEV1/FVC ratio: 67 %
Post FEV6/FVC ratio: 97 %
Pre FEV1/FVC ratio: 70 %
Pre FEV6/FVC Ratio: 100 %
RV % pred: 146 %
RV: 3.41 L
TLC % pred: 114 %
TLC: 6.15 L

## 2024-05-04 MED ORDER — ALBUTEROL SULFATE HFA 108 (90 BASE) MCG/ACT IN AERS: 2.0000 | INHALATION_SPRAY | Freq: Four times a day (QID) | RESPIRATORY_TRACT | 2 refills | Status: AC | PRN

## 2024-05-04 MED ORDER — FLUTICASONE-SALMETEROL 250-50 MCG/ACT IN AEPB: 1.0000 | INHALATION_SPRAY | Freq: Two times a day (BID) | RESPIRATORY_TRACT | 3 refills | Status: AC

## 2024-05-04 NOTE — Patient Instructions (Signed)
 Full pft performed today

## 2024-05-04 NOTE — Patient Instructions (Signed)
 I will see you back in about 6 months  Use your regular inhaler twice a day  Use your albuterol as needed  Graded activities as tolerated  Your breathing study does show airway narrowing that improved significantly with inhaler use

## 2024-05-04 NOTE — Progress Notes (Signed)
 Full pft performed today

## 2024-05-04 NOTE — Progress Notes (Signed)
 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  HPI:  Patient with a history of asthma She does have shortness of breath, wheezing She has been using Wixela but only uses Advair as needed  Uses rescue inhaler as needed  Albuterol does improve breathing  She denies any cough, no chest pain or chest discomfort Activity level has decreased recently due to weather changes duskiness which sometimes exacerbate breathing difficulties She previously was more active outdoors when weather conditions were more favorable  Does have a past history of narcolepsy diagnosed in the 90s Study is not available She is on Ritalin  which she uses as needed Usually has to take a daily nap  Last saw Dr. Neysa in 2021 - History documented back then include narcolepsy/cataplexy, insomnia Diagnosis made in the 90s, records not available, was having frequent cataplexy Nuvigil caused depression Usually goes to bed about 1 AM, falls asleep in a few minutes About 3 awakenings Final wake up time between 530 and 7 AM Weight is stable She states she does not sleep well  Social smoking in the remote past  Worked in banking  Outpatient Encounter Medications as of 05/04/2024  Medication Sig   levothyroxine  (SYNTHROID ) 137 MCG tablet Take 137 mcg by mouth.   lisinopril (PRINIVIL,ZESTRIL) 5 MG tablet Take 1 tablet by mouth daily.   methylphenidate  (RITALIN  LA) 20 MG 24 hr capsule 1-2 daily as needed   pantoprazole (PROTONIX) 40 MG tablet Take 1 tablet by mouth daily.   [DISCONTINUED] albuterol (VENTOLIN HFA) 108 (90 Base) MCG/ACT inhaler Inhale 2 puffs into the lungs.   [DISCONTINUED] fluticasone -salmeterol (WIXELA INHUB) 250-50 MCG/ACT AEPB  Inhale 1 puff into the lungs in the morning and at bedtime.   albuterol (VENTOLIN HFA) 108 (90 Base) MCG/ACT inhaler Inhale 2 puffs into the lungs every 6 (six) hours as needed for wheezing or shortness of breath.   fluticasone -salmeterol (WIXELA INHUB) 250-50 MCG/ACT AEPB Inhale 1 puff into the lungs in the morning and at bedtime.   levothyroxine  (SYNTHROID , LEVOTHROID) 88 MCG tablet Take 1 tablet by mouth daily.   No facility-administered encounter medications on file as of 05/04/2024.    Allergies as of 05/04/2024 - Review Complete 05/04/2024  Allergen Reaction Noted   Dust mite extract  02/19/2024   Shellfish allergy      Past Medical History:  Diagnosis Date   Asthma    Hypothyroid    Narcolepsy    Skin cancer     No past surgical history on file.  Family History  Problem Relation Age of Onset   Asthma Mother    Heart disease Father    Breast cancer Neg Hx     Social History   Socioeconomic History   Marital status: Married    Spouse name: Not on file   Number of children: Not on file   Years of education: Not on file   Highest education level: Not on file  Occupational History   Occupation: Firefighter  Tobacco Use   Smoking status: Never   Smokeless tobacco: Never  Substance and Sexual Activity   Alcohol use: Not on file   Drug use: Not on file   Sexual  activity: Not on file  Other Topics Concern   Not on file  Social History Narrative   Not on file   Social Drivers of Health   Financial Resource Strain: Not on file  Food Insecurity: Low Risk  (10/13/2023)   Received from [facility]   Hunger Vital Sign    Within the past 12 months, you worried that your food would run out before you got money to buy more: Never true    Within the past 12 months, the food you bought just didn't last and you didn't have money to get more. : Never true  Transportation Needs: No Transportation Needs (10/13/2023)   Received from [facility]    In  the past 12 months, has lack of reliable transportation kept you from medical appointments, meetings, work or from getting things needed for daily living? : No  Physical Activity: Not on file  Stress: Not on file  Social Connections: Not on file  Intimate Partner Violence: Not on file    Review of Systems  Respiratory:  Positive for shortness of breath and wheezing.   Psychiatric/Behavioral:  Positive for sleep disturbance.     Vitals:   05/04/24 1114  BP: 106/66  Pulse: 95  Temp: 97.9 F (36.6 C)  SpO2: 96%     Physical Exam Constitutional:      Appearance: She is obese. She is not ill-appearing.  HENT:     Head: Normocephalic.     Mouth/Throat:     Mouth: Mucous membranes are moist.  Eyes:     General: No scleral icterus. Cardiovascular:     Rate and Rhythm: Normal rate and regular rhythm.     Heart sounds: No murmur heard.    No friction rub.  Pulmonary:     Effort: No respiratory distress.     Breath sounds: No stridor. No wheezing or rhonchi.  Musculoskeletal:     Cervical back: No rigidity or tenderness.  Neurological:     Mental Status: She is alert.  Psychiatric:        Mood and Affect: Mood normal.       02/19/2024    9:00 AM  Results of the Epworth flowsheet  Sitting and reading 0  Watching TV 3  Sitting, inactive in a public place (e.g. a theatre or a meeting) 3  As a passenger in a car for an hour without a break 3  Lying down to rest in the afternoon when circumstances permit 3  Sitting and talking to someone 3  Sitting quietly after a lunch without alcohol 3  In a car, while stopped for a few minutes in traffic 3  Total score 21     Data Reviewed: Records from Dr. Neysa from 2021 reviewed  Most recent visit with primary care at [facility] reviewed  Pulmonary function test reviewed showing FEV1 of 71% RV/TLC of 55% suggesting air trapping, there was significant bronchodilator response  Assessment/Plan:  Obstructive lung disease with air  trapping, Significant bronchodilator response - Encouraged to use Wixela twice a day - Ensure availability of rescue inhaler to be used as needed - Avoid known triggers  Encouraged regular physical activity to maintain muscle mass and overall health  Follow-up in about 6 months  Longstanding history of narcolepsy - Uses Ritalin  as needed - Nonrestorative sleep with a history of insomnia - Excessive daytime sleepiness  Consider repeat sleep study and MSLT  Follow-up in about 6 months  Encouraged to call with  significant concerns   Jennet Epley MD [facility] 05/04/2024, 8:27 PM  CC: Sharl Tully HERO, PA-C

## 2024-05-20 ENCOUNTER — Ambulatory Visit: Payer: Self-pay | Admitting: Pulmonary Disease
